# Patient Record
Sex: Male | Born: 1985 | Race: White | Hispanic: No | Marital: Single | Smoking: Current every day smoker
Health system: Southern US, Community
[De-identification: ages and names within clinical notes are randomized; demographics above are authoritative.]

## PROBLEM LIST (undated history)

## (undated) DIAGNOSIS — Z9989 Dependence on other enabling machines and devices: Secondary | ICD-10-CM

## (undated) DIAGNOSIS — J45909 Unspecified asthma, uncomplicated: Secondary | ICD-10-CM

## (undated) DIAGNOSIS — I319 Disease of pericardium, unspecified: Secondary | ICD-10-CM

## (undated) DIAGNOSIS — I514 Myocarditis, unspecified: Secondary | ICD-10-CM

## (undated) DIAGNOSIS — G4733 Obstructive sleep apnea (adult) (pediatric): Secondary | ICD-10-CM

## (undated) HISTORY — DX: Obstructive sleep apnea (adult) (pediatric): G47.33

## (undated) HISTORY — DX: Dependence on other enabling machines and devices: Z99.89

---

## 2007-12-12 ENCOUNTER — Ambulatory Visit: Payer: Self-pay | Admitting: Internal Medicine

## 2009-01-14 ENCOUNTER — Ambulatory Visit: Payer: Self-pay | Admitting: Family Medicine

## 2009-05-24 ENCOUNTER — Emergency Department: Payer: Self-pay | Admitting: Emergency Medicine

## 2009-05-26 ENCOUNTER — Emergency Department: Payer: Self-pay | Admitting: Emergency Medicine

## 2010-08-27 ENCOUNTER — Ambulatory Visit: Payer: Self-pay | Admitting: Internal Medicine

## 2010-11-13 ENCOUNTER — Ambulatory Visit: Payer: Self-pay | Admitting: Internal Medicine

## 2011-11-24 ENCOUNTER — Ambulatory Visit: Payer: Self-pay

## 2011-11-24 LAB — RAPID STREP-A WITH REFLX: Micro Text Report: NEGATIVE

## 2011-11-27 LAB — BETA STREP CULTURE(ARMC)

## 2012-05-11 ENCOUNTER — Ambulatory Visit: Payer: Self-pay | Admitting: Family Medicine

## 2012-05-30 ENCOUNTER — Ambulatory Visit: Payer: Self-pay | Admitting: Internal Medicine

## 2012-05-30 LAB — CBC WITH DIFFERENTIAL/PLATELET
Basophil #: 0.1 10*3/uL (ref 0.0–0.1)
Eosinophil %: 0.5 %
HCT: 45.3 % (ref 40.0–52.0)
Lymphocyte #: 2.8 10*3/uL (ref 1.0–3.6)
MCH: 30.4 pg (ref 26.0–34.0)
MCHC: 33.9 g/dL (ref 32.0–36.0)
MCV: 90 fL (ref 80–100)
Monocyte #: 1.7 x10 3/mm — ABNORMAL HIGH (ref 0.2–1.0)
Monocyte %: 9.3 %
Neutrophil #: 13.3 10*3/uL — ABNORMAL HIGH (ref 1.4–6.5)
Platelet: 202 10*3/uL (ref 150–440)
RBC: 5.06 10*6/uL (ref 4.40–5.90)
RDW: 12.7 % (ref 11.5–14.5)

## 2012-05-30 LAB — COMPREHENSIVE METABOLIC PANEL
Alkaline Phosphatase: 133 U/L (ref 50–136)
Anion Gap: 9 (ref 7–16)
BUN: 9 mg/dL (ref 7–18)
Bilirubin,Total: 0.5 mg/dL (ref 0.2–1.0)
Creatinine: 0.96 mg/dL (ref 0.60–1.30)
EGFR (African American): >60
EGFR (Non-African Amer.): >60
Glucose: 98 mg/dL (ref 65–99)
Osmolality: 280 (ref 275–301)
Total Protein: 8.4 g/dL — ABNORMAL HIGH (ref 6.4–8.2)

## 2012-06-01 LAB — BETA STREP CULTURE(ARMC)

## 2013-11-25 DIAGNOSIS — I319 Disease of pericardium, unspecified: Secondary | ICD-10-CM

## 2013-11-25 HISTORY — DX: Disease of pericardium, unspecified: I31.9

## 2013-12-03 ENCOUNTER — Ambulatory Visit: Payer: Self-pay

## 2013-12-03 LAB — CBC WITH DIFFERENTIAL/PLATELET
BASOS ABS: 0 10*3/uL (ref 0.0–0.1)
Basophil %: 0.2 %
EOS ABS: 0 10*3/uL (ref 0.0–0.7)
EOS PCT: 0 %
HCT: 44.7 % (ref 40.0–52.0)
HGB: 15 g/dL (ref 13.0–18.0)
LYMPHS ABS: 1.8 10*3/uL (ref 1.0–3.6)
LYMPHS PCT: 11.7 %
MCH: 29.4 pg (ref 26.0–34.0)
MCHC: 33.6 g/dL (ref 32.0–36.0)
MCV: 88 fL (ref 80–100)
MONO ABS: 1.4 x10 3/mm — AB (ref 0.2–1.0)
Monocyte %: 9 %
NEUTROS ABS: 12.2 10*3/uL — AB (ref 1.4–6.5)
Neutrophil %: 79.1 %
PLATELETS: 205 10*3/uL (ref 150–440)
RBC: 5.11 10*6/uL (ref 4.40–5.90)
RDW: 12.4 % (ref 11.5–14.5)
WBC: 15.4 10*3/uL — AB (ref 3.8–10.6)

## 2013-12-03 LAB — RAPID INFLUENZA A&B ANTIGENS

## 2013-12-06 ENCOUNTER — Ambulatory Visit: Payer: Self-pay

## 2013-12-10 ENCOUNTER — Encounter (HOSPITAL_COMMUNITY): Payer: Self-pay | Admitting: Internal Medicine

## 2013-12-10 ENCOUNTER — Emergency Department: Payer: Self-pay | Admitting: Emergency Medicine

## 2013-12-10 ENCOUNTER — Inpatient Hospital Stay (HOSPITAL_COMMUNITY)
Admission: AD | Admit: 2013-12-10 | Discharge: 2013-12-14 | DRG: 237 | Disposition: A | Discharge status: Home / Self Care | Payer: BC Managed Care – PPO | Source: Other Acute Inpatient Hospital | Attending: Internal Medicine | Admitting: Internal Medicine

## 2013-12-10 DIAGNOSIS — E876 Hypokalemia: Secondary | ICD-10-CM | POA: Diagnosis present

## 2013-12-10 DIAGNOSIS — F17200 Nicotine dependence, unspecified, uncomplicated: Secondary | ICD-10-CM | POA: Diagnosis present

## 2013-12-10 DIAGNOSIS — I309 Acute pericarditis, unspecified: Secondary | ICD-10-CM

## 2013-12-10 DIAGNOSIS — R0609 Other forms of dyspnea: Secondary | ICD-10-CM | POA: Diagnosis present

## 2013-12-10 DIAGNOSIS — J189 Pneumonia, unspecified organism: Secondary | ICD-10-CM | POA: Diagnosis present

## 2013-12-10 DIAGNOSIS — R0989 Other specified symptoms and signs involving the circulatory and respiratory systems: Secondary | ICD-10-CM | POA: Diagnosis present

## 2013-12-10 DIAGNOSIS — J45909 Unspecified asthma, uncomplicated: Secondary | ICD-10-CM | POA: Diagnosis present

## 2013-12-10 DIAGNOSIS — I3139 Other pericardial effusion (noninflammatory): Secondary | ICD-10-CM

## 2013-12-10 DIAGNOSIS — I319 Disease of pericardium, unspecified: Secondary | ICD-10-CM | POA: Diagnosis present

## 2013-12-10 DIAGNOSIS — I313 Pericardial effusion (noninflammatory): Secondary | ICD-10-CM

## 2013-12-10 DIAGNOSIS — A419 Sepsis, unspecified organism: Secondary | ICD-10-CM | POA: Diagnosis present

## 2013-12-10 DIAGNOSIS — R9431 Abnormal electrocardiogram [ECG] [EKG]: Secondary | ICD-10-CM | POA: Diagnosis present

## 2013-12-10 DIAGNOSIS — J180 Bronchopneumonia, unspecified organism: Secondary | ICD-10-CM | POA: Diagnosis present

## 2013-12-10 DIAGNOSIS — J90 Pleural effusion, not elsewhere classified: Secondary | ICD-10-CM

## 2013-12-10 DIAGNOSIS — E860 Dehydration: Secondary | ICD-10-CM | POA: Diagnosis present

## 2013-12-10 DIAGNOSIS — D649 Anemia, unspecified: Secondary | ICD-10-CM | POA: Diagnosis present

## 2013-12-10 DIAGNOSIS — D72829 Elevated white blood cell count, unspecified: Secondary | ICD-10-CM | POA: Diagnosis present

## 2013-12-10 DIAGNOSIS — E669 Obesity, unspecified: Secondary | ICD-10-CM | POA: Diagnosis present

## 2013-12-10 DIAGNOSIS — R509 Fever, unspecified: Secondary | ICD-10-CM | POA: Diagnosis present

## 2013-12-10 HISTORY — DX: Unspecified asthma, uncomplicated: J45.909

## 2013-12-10 LAB — URINE MICROSCOPIC-ADD ON

## 2013-12-10 LAB — CBC WITH DIFFERENTIAL/PLATELET
BASOS ABS: 0 10*3/uL (ref 0.0–0.1)
Basophil %: 0.3 %
EOS PCT: 0.2 %
Eosinophil #: 0 10*3/uL (ref 0.0–0.7)
HCT: 38.7 % — ABNORMAL LOW (ref 40.0–52.0)
HGB: 13.2 g/dL (ref 13.0–18.0)
LYMPHS PCT: 16.7 %
Lymphocyte #: 2.5 10*3/uL (ref 1.0–3.6)
MCH: 29.9 pg (ref 26.0–34.0)
MCHC: 34.1 g/dL (ref 32.0–36.0)
MCV: 88 fL (ref 80–100)
MONO ABS: 1.8 x10 3/mm — AB (ref 0.2–1.0)
Monocyte %: 12.3 %
NEUTROS PCT: 70.5 %
Neutrophil #: 10.6 10*3/uL — ABNORMAL HIGH (ref 1.4–6.5)
Platelet: 314 10*3/uL (ref 150–440)
RBC: 4.41 10*6/uL (ref 4.40–5.90)
RDW: 13 % (ref 11.5–14.5)
WBC: 15 10*3/uL — AB (ref 3.8–10.6)

## 2013-12-10 LAB — CBC
HCT: 32.3 % — ABNORMAL LOW (ref 39.0–52.0)
Hemoglobin: 11.5 g/dL — ABNORMAL LOW (ref 13.0–17.0)
MCH: 30.3 pg (ref 26.0–34.0)
MCHC: 35.6 g/dL (ref 30.0–36.0)
MCV: 85.2 fL (ref 78.0–100.0)
PLATELETS: 365 10*3/uL (ref 150–400)
RBC: 3.79 MIL/uL — ABNORMAL LOW (ref 4.22–5.81)
RDW: 13 % (ref 11.5–15.5)
WBC: 14 10*3/uL — ABNORMAL HIGH (ref 4.0–10.5)

## 2013-12-10 LAB — COMPREHENSIVE METABOLIC PANEL
ALK PHOS: 133 U/L — AB (ref 39–117)
ALT: 52 U/L (ref 0–53)
AST: 20 U/L (ref 0–37)
Albumin: 2.8 g/dL — ABNORMAL LOW (ref 3.5–5.2)
BILIRUBIN TOTAL: 0.7 mg/dL (ref 0.3–1.2)
BUN: 11 mg/dL (ref 6–23)
CHLORIDE: 96 meq/L (ref 96–112)
CO2: 25 mEq/L (ref 19–32)
Calcium: 8.8 mg/dL (ref 8.4–10.5)
Creatinine, Ser: 0.62 mg/dL (ref 0.50–1.35)
GFR calc non Af Amer: >90 mL/min (ref >90)
Glucose, Bld: 173 mg/dL — ABNORMAL HIGH (ref 70–99)
Potassium: 3.6 mEq/L — ABNORMAL LOW (ref 3.7–5.3)
SODIUM: 137 meq/L (ref 137–147)
Total Protein: 8 g/dL (ref 6.0–8.3)

## 2013-12-10 LAB — APTT: aPTT: 37 seconds (ref 24–37)

## 2013-12-10 LAB — COMPREHENSIVE METABOLIC PANEL WITH GFR
Albumin: 2.9 g/dL — ABNORMAL LOW
Alkaline Phosphatase: 157 U/L — ABNORMAL HIGH
Anion Gap: 11
BUN: 11 mg/dL
Bilirubin,Total: 0.8 mg/dL
Calcium, Total: 9.1 mg/dL
Chloride: 96 mmol/L — ABNORMAL LOW
Co2: 25 mmol/L
Creatinine: 0.86 mg/dL
EGFR (African American): >60
EGFR (Non-African Amer.): >60
Glucose: 105 mg/dL — ABNORMAL HIGH
Osmolality: 264
Potassium: 3 mmol/L — ABNORMAL LOW
SGOT(AST): 35 U/L
SGPT (ALT): 74 U/L
Sodium: 132 mmol/L — ABNORMAL LOW
Total Protein: 8.4 g/dL — ABNORMAL HIGH

## 2013-12-10 LAB — TYPE AND SCREEN
ABO/RH(D): O POS
Antibody Screen: NEGATIVE

## 2013-12-10 LAB — URINALYSIS, ROUTINE W REFLEX MICROSCOPIC
Glucose, UA: NEGATIVE mg/dL
HGB URINE DIPSTICK: NEGATIVE
KETONES UR: 40 mg/dL — AB
Leukocytes, UA: NEGATIVE
Nitrite: NEGATIVE
Protein, ur: 30 mg/dL — AB
SPECIFIC GRAVITY, URINE: 1.034 — AB (ref 1.005–1.030)
UROBILINOGEN UA: 4 mg/dL — AB (ref 0.0–1.0)
pH: 6.5 (ref 5.0–8.0)

## 2013-12-10 LAB — TROPONIN I: Troponin-I: <0.02 ng/mL

## 2013-12-10 LAB — SEDIMENTATION RATE: Sed Rate: 121 mm/hr — ABNORMAL HIGH (ref 0–16)

## 2013-12-10 LAB — PROTIME-INR
INR: 1.32 (ref 0.00–1.49)
PROTHROMBIN TIME: 16.1 s — AB (ref 11.6–15.2)

## 2013-12-10 LAB — MAGNESIUM: Magnesium: 2.4 mg/dL (ref 1.5–2.5)

## 2013-12-10 MED ORDER — CHLORHEXIDINE GLUCONATE 4 % EX LIQD
CUTANEOUS | Status: AC
  Filled 2013-12-10: qty 15

## 2013-12-10 MED ORDER — ACETAMINOPHEN 325 MG PO TABS
650.0000 mg | ORAL_TABLET | Freq: Four times a day (QID) | ORAL | Status: DC | PRN
  Administered 2013-12-10: 650 mg via ORAL
  Filled 2013-12-10: qty 2

## 2013-12-10 MED ORDER — ACETAMINOPHEN 650 MG RE SUPP: 650.0000 mg | Freq: Four times a day (QID) | RECTAL | Status: DC | PRN

## 2013-12-10 MED ORDER — DEXTROSE 5 % IV SOLN
1.0000 g | Freq: Three times a day (TID) | INTRAVENOUS | Status: DC
  Administered 2013-12-11 – 2013-12-14 (×9): 1 g via INTRAVENOUS
  Filled 2013-12-10 (×13): qty 1

## 2013-12-10 MED ORDER — ALBUTEROL SULFATE (2.5 MG/3ML) 0.083% IN NEBU: 2.5000 mg | INHALATION_SOLUTION | RESPIRATORY_TRACT | Status: DC | PRN

## 2013-12-10 MED ORDER — VANCOMYCIN HCL IN DEXTROSE 1-5 GM/200ML-% IV SOLN
1000.0000 mg | Freq: Two times a day (BID) | INTRAVENOUS | Status: DC
  Administered 2013-12-11 – 2013-12-13 (×5): 1000 mg via INTRAVENOUS
  Filled 2013-12-10 (×8): qty 200

## 2013-12-10 MED ORDER — ONDANSETRON HCL 4 MG PO TABS
4.0000 mg | ORAL_TABLET | Freq: Four times a day (QID) | ORAL | Status: DC | PRN
  Administered 2013-12-10: 4 mg via ORAL
  Filled 2013-12-10: qty 1

## 2013-12-10 MED ORDER — SODIUM CHLORIDE 0.9 % IJ SOLN: 3.0000 mL | Freq: Two times a day (BID) | INTRAMUSCULAR | Status: DC

## 2013-12-10 MED ORDER — ONDANSETRON HCL 4 MG/2ML IJ SOLN: 4.0000 mg | Freq: Four times a day (QID) | INTRAMUSCULAR | Status: DC | PRN

## 2013-12-10 MED ORDER — CHLORHEXIDINE GLUCONATE CLOTH 2 % EX PADS: 6.0000 | MEDICATED_PAD | Freq: Every day | CUTANEOUS | Status: DC

## 2013-12-10 MED ORDER — VANCOMYCIN HCL 10 G IV SOLR
2250.0000 mg | Freq: Once | INTRAVENOUS | Status: AC
  Administered 2013-12-11: 2250 mg via INTRAVENOUS
  Filled 2013-12-10: qty 2250

## 2013-12-10 MED ORDER — POTASSIUM CHLORIDE IN NACL 40-0.9 MEQ/L-% IV SOLN
INTRAVENOUS | Status: DC
  Administered 2013-12-10: 60 mL/h via INTRAVENOUS
  Filled 2013-12-10: qty 1000

## 2013-12-10 NOTE — Progress Notes (Addendum)
[address]       [place] [id number]             [phone]        Andrew MewDavid Harmon [facility] Medical Record #[id number] Date of Birth: [birth year]  Referring: No ref. provider found Primary Care: Andrew CarnesWoodruff, [name], MD  Chief Complaint:   Fever and sweats   History of Present Illness:     28 yo male with 10 day history of episodic fever, chills. He has been treated  as outpatient with tamiflu and Zithromax, but was told flu swab was negative. Presented to [facility] ER today because was not feeling better after 10 days of treatment, then transferred her from ER. Pateint is smoker, no one he lives with has been sick. Only [facility] was [place] [facility] [place]   Current Activity/ Functional Status: Patient is independent with mobility/ambulation, transfers, ADL's, IADL's.   Zubrod Score: At the time of surgery this patient's most appropriate activity status/level should be described as: [x]     0    Normal activity, no symptoms []     1    Restricted in physical strenuous activity but ambulatory, able to do out light work []     2    Ambulatory and capable of self care, unable to do work activities, up and about                 more than 50%  Of the time                            []     3    Only limited self care, in bed greater than 50% of waking hours []     4    Completely disabled, no self care, confined to bed or chair []     5    Moribund  Past Medical History  Diagnosis Date  . Asthma     Past Surgical History  Procedure Laterality Date  . No past surgeries      History  Smoking status  . Current Every Day Smoker -- 1.00 packs/day  Smokeless tobacco  . Not on file    History  Alcohol Use  . Yes    History   Social History  . Marital Status: Unknown    Spouse Name: N/A    Number of Children: N/A  . Years of Education: N/A   Occupational History  . Not on file.   Social History Main Topics  . Smoking status: Current Every Day  Smoker -- 1.00 packs/day  . Smokeless tobacco: Not on file  . Alcohol Use: Yes  . Drug Use: No  . Sexual Activity: Yes   Other Topics Concern  . Not on file   Social History Narrative  . No narrative on file    Allergies  Allergen Reactions  . Amoxicillin Hives  . Penicillins     Per his doctor    Current Facility-Administered Medications  Medication Dose Route Frequency Provider Last Rate Last Dose  . 0.9 % irrigation (POUR BTL)    PRN Delight OvensEdward B Boleslaw Borghi, MD   2,000 mL at 12/11/13 0716  . 0.9 % NaCl with KCl 40 mEq / L  infusion   Intravenous Continuous Elease EtienneAnand D Hongalgi, MD 60 mL/hr at 12/10/13 2300    . [facility] HOLD] acetaminophen (TYLENOL) tablet 650 mg  650 mg Oral Q6H PRN Theadora RamaAnand  Irena Cords, MD   650 mg at 12/10/13 2101   Or  . [facility] HOLD] acetaminophen (TYLENOL) suppository 650 mg  650 mg Rectal Q6H PRN Elease Etienne, MD      . [facility] HOLD] albuterol (PROVENTIL) (2.5 MG/3ML) 0.083% nebulizer solution 2.5 mg  2.5 mg Nebulization Q2H PRN Elease Etienne, MD      . [facility] HOLD] ceFEPIme (MAXIPIME) 1 g in dextrose 5 % 50 mL IVPB  1 g Intravenous Q8H Elease Etienne, MD   1 g at 12/11/13 0200  . chlorhexidine (HIBICLENS) 4 % liquid           . [[facility] HOLD] Chlorhexidine Gluconate Cloth 2 % PADS 6 each  6 each Topical Q0600 Delight Ovens, MD      . [facility] HOLD] ondansetron [facility]) tablet 4 mg  4 mg Oral Q6H PRN Elease Etienne, MD   4 mg at 12/10/13 2103   Or  . [[facility] HOLD] ondansetron (ZOFRAN) injection 4 mg  4 mg Intravenous Q6H PRN Elease Etienne, MD      . [facility] HOLD] sodium chloride 0.9 % injection 3 mL  3 mL Intravenous Q12H Elease Etienne, MD      . [facility] HOLD] vancomycin (VANCOCIN) IVPB 1000 mg/200 mL premix  1,000 mg Intravenous Q12H Elease Etienne, MD       Facility-Administered Medications Ordered in Other Encounters  Medication Dose Route Frequency Provider Last Rate Last Dose  . fentaNYL (SUBLIMAZE) injection    Anesthesia Intra-op Bishop Limbo, CRNA   50 mcg  at 12/11/13 [phone]  . lactated ringers infusion    Continuous PRN Bishop Limbo, CRNA      . lactated ringers infusion    Continuous PRN Bishop Limbo, CRNA      . midazolam (VERSED) 5 MG/5ML injection    Anesthesia Intra-op Bishop Limbo, CRNA   2 mg at 12/11/13 9604    Prescriptions prior to admission  Medication Sig Dispense Refill  . acetaminophen (TYLENOL) 325 MG tablet Take 650 mg by mouth every 6 (six) hours as needed for mild pain or headache.      . ibuprofen (ADVIL,MOTRIN) 800 MG tablet Take 800 mg by mouth every 8 (eight) hours as needed.        History reviewed. No pertinent family history.   Review of Systems:     Cardiac Review of Systems: Y or N  Chest Pain [ y  ]  Resting SOB [n   ] Exertional SOB  Cove.Etienne  ]  Kenese.Mounts [  ]   Pedal Edema [   ]    Palpitations [  ] Syncope  [  ]   Presyncope [   ]  General Review of Systems: [Y] = yes [  ]=no Constitional: recent weight change [ n]; anorexia [ y ]; fatigue [ y ]; nausea Cove.Etienne  ]; night sweats [  ]; fever [ y ]; or chills [ y ]                                                               Dental: poor dentition[  ]; Last Dentist visit:   Eye : blurred vision [  ]; diplopia [   ]; vision  changes [  ];  Amaurosis fugax[  ]; Resp: cough [ y ];  wheezing[  ];  hemoptysis[ n ]; shortness of breath[y  ]; paroxysmal nocturnal dyspnea[  ]; dyspnea on exertion[  ]; or orthopnea[  ];  GI:  gallstones[  ], vomiting[  ];  dysphagia[  ]; melena[  ];  hematochezia [  ]; heartburn[  ];   Hx of  Colonoscopy[  ]; GU: kidney stones [  ]; hematuria[  ];   dysuria [  ];  nocturia[  ];  history of     obstruction [  ]; urinary frequency [ n ]             Skin: rash, swelling[  ];, hair loss[  ];  peripheral edema[  ];  or itching[  ]; Musculosketetal: myalgias[  ];  joint swelling[  ];  joint erythema[  ];  joint pain[  ];  back pain[  ];  Heme/Lymph: bruising[  ];  bleeding[  ];  anemia[  ];  Neuro: TIA[ n ];  headaches[  ];  stroke[   ];  vertigo[  ];  seizures[  ];   paresthesias[  ];  difficulty walking[ n];  Psych:depression[n  ]; anxiety[ n ];  Endocrine: diabetes[n  ];  thyroid dysfunction[  ];  Immunizations: Flu [ n ]; Pneumococcal[n  ];  Other:  Physical Exam: BP 137/68  Pulse 94  Temp(Src) 98.5 F (36.9 C) (Oral)  Resp 19  Ht 5\' 10"  (1.778 m)  Wt 237 lb 3.4 oz (107.6 kg)  BMI 34.04 kg/m2  SpO2 94%  General appearance: alert, cooperative and appears stated age Neurologic: intact Heart: regular rate and rhythm, S1, S2 normal, no murmur, click, rub or gallop and no rub Lungs: diminished breath sounds bibasilar Abdomen: soft, non-tender; bowel sounds normal; no masses,  no organomegaly Extremities: extremities normal, atraumatic, no cyanosis or edema and Homans sign is negative, no sign of DVT no jvd diaphoric at time of exam   Diagnostic Studies & Laboratory data:     Recent Radiology Findings:  See pacs for films at [facility] ct of chest shows 2.3 cm pericardial effusion, prob transudate by ct numbers  Recent Lab Findings: Lab Results  Component Value Date   WBC 14.0* 12/10/2013   HGB 11.5* 12/10/2013   HCT 32.3* 12/10/2013   PLT 365 12/10/2013   GLUCOSE 173* 12/10/2013   ALT 52 12/10/2013   AST 20 12/10/2013   NA 137 12/10/2013   K 3.6* 12/10/2013   CL 96 12/10/2013   CREATININE 0.62 12/10/2013   BUN 11 12/10/2013   CO2 25 12/10/2013   INR 1.32 12/10/2013     Assessment / Plan:     Mod to large pericardial effusion by ct scan of the chest, poss  Viral. I have discussed with patient drainage with pericardial window in the am both for therapeutic reasons but also for diagnostic reasons.  Patient does not appear in tamponade clinically.        Delight Ovens MD      [address] [employer].[address] [place] [address] Office [phone]   Beeper [phone]  12/11/2013 7:19 AM

## 2013-12-10 NOTE — Progress Notes (Signed)
ANTIBIOTIC CONSULT NOTE - INITIAL  Pharmacy Consult for vancomycin Indication: pneumonia  Allergies  Allergen Reactions  . Amoxicillin   . Penicillins     Patient Measurements: Height: 5\' 10"  (177.8 cm) Weight: 236 lb 15.9 oz (107.5 kg) IBW/kg (Calculated) : 73 Adjusted Body Weight: 86.8 kg  Vital Signs: Temp: 98.1 F (36.7 C) (02/16 2029) Temp src: Oral (02/16 2029) BP: 127/91 mmHg (02/16 2029) Pulse Rate: 91 (02/16 2029)   Labs: No results found for this basename: WBC, HGB, PLT, LABCREA, CREATININE,  in the last 72 hours CrCl is unknown because no creatinine reading has been taken. No results found for this basename: VANCOTROUGH, VANCOPEAK, VANCORANDOM, GENTTROUGH, GENTPEAK, GENTRANDOM, TOBRATROUGH, TOBRAPEAK, TOBRARND, AMIKACINPEAK, AMIKACINTROU, AMIKACIN,  in the last 72 hours   Medical History: Past Medical History  Diagnosis Date  . Asthma     Medications:  Prescriptions prior to admission  Medication Sig Dispense Refill  . acetaminophen (TYLENOL) 325 MG tablet Take 325-650 mg by mouth every 6 (six) hours as needed for mild pain, fever or headache.      . ibuprofen (ADVIL,MOTRIN) 200 MG tablet Take 200-400 mg by mouth every 6 (six) hours as needed for fever, headache or mild pain.       Assessment: 28 year old male with history of cough and sob.  Recently completed a course of tamiflu and azithromycin as an outpatient, with no improvement in symptoms.  Pt presented today to an outside hospital, notable labs: wbc 15, Scr 0.86, K 3, Na 132.  Pt received levofloxacin 750 mg x1 at outside hospital.  Pharmacy consulted to dose vancomycin.  Blood cultures x2 drawn.  Goal of Therapy:  Vancomycin trough level 15-20 mcg/ml  Plan:  Vancomycin 2250 mg IV x1 loading dose Followed by vancomycin 1 g IV q12h Cefepime 1 g IV q8h Follow Scr, cultures, clinical progress  Agapito Games, PharmD, BCPS Clinical Pharmacist 12/10/2013 8:57 PM

## 2013-12-10 NOTE — Progress Notes (Signed)
Pt arrived to 2C17 via [facility]. Pt awake, alert and oriented. No apparent discomfort or distress. Pt denies pain at this time. VSS. Dr. Thedore Mins  - admitting MD - notified of patients arrival. Awaiting admission orders. Pt is oriented to the room, bed, etc. Bed low and locked, call light within reach, side rails up x2. Will continue to monitor.  - Alwyn Ren, RN

## 2013-12-10 NOTE — Consult Note (Signed)
Cardiology Consult Note Andrew Massed, MD No ref. provider found  Reason for consult: pericardial effusion  History of Present Illness (and review of medical records): Andrew Harmon is a 28 y.o. male who presents for evaluation of pericardial effusion as a transfer from [facility].  He has no prior cardiac history and not currently treated for any chronic medical problems.  He presented to the ED with persistent fevers up to 103.  Patient states that this was his 8 day with fever.  He reports fevers, chills, and productive cough.  He also reports chest discomfort with pleuritic symptoms which were initial worse around 9/10 and now 2/10.  He has been treated with Azithromycin as outpatient with no significant improvement.  CT scan revealed moderate- large pericardial effusion, along with patchy opacity in Left upper lob c/w infection/PNA.  EKG also revealed diffuse ST elevations with PR depression.  He is hemodynamically stable without signs of tamponade.  He has been admitted to hospitalist service and evaluated by Dr. Servando Snare for possible pericardial window.  Review of Systems Pertinent items are noted in HPI. Further review of systems was otherwise negative other than stated in HPI.  Patient Active Problem List   Diagnosis Date Noted  . Pericardial effusion 12/10/2013  . Fever, unspecified 12/10/2013  . Leukocytosis, unspecified 12/10/2013  . CAP (community acquired pneumonia) 12/10/2013  . Hypokalemia 12/10/2013   Past Medical History  Diagnosis Date  . Asthma     Past Surgical History  Procedure Laterality Date  . No past surgeries      Prescriptions prior to admission  Medication Sig Dispense Refill  . acetaminophen (TYLENOL) 325 MG tablet Take 325-650 mg by mouth every 6 (six) hours as needed for mild pain, fever or headache.      . ibuprofen (ADVIL,MOTRIN) 200 MG tablet Take 200-400 mg by mouth every 6 (six) hours as needed for fever, headache or mild pain.       Allergies   Allergen Reactions  . Amoxicillin Hives    History  Substance Use Topics  . Smoking status: Current Every Day Smoker -- 1.00 packs/day  . Smokeless tobacco: Not on file  . Alcohol Use: Yes    History reviewed. No pertinent family history.   Objective:  Patient Vitals for the past 8 hrs:  BP Temp Temp src Pulse Resp SpO2 Height Weight  12/10/13 2100 134/76 mmHg - - 93 22 93 % - -  12/10/13 2046 - - - - - 93 % - -  12/10/13 2029 127/91 mmHg 98.1 F (36.7 C) Oral 91 33 94 % - -  12/10/13 1900 134/90 mmHg 98.2 F (36.8 C) Oral 102 14 96 % '5\' 10"'  (1.778 m) 107.5 kg (236 lb 15.9 oz)   General appearance: alert, cooperative, obese male in NAD Head: Normocephalic, without obvious abnormality, atraumatic Eyes: conjunctivae/corneas clear. PERRL, EOM's intact. Fundi benign. Neck: no carotid bruit, no JVD and supple, symmetrical, trachea midline Lungs: clear to auscultation bilaterally Chest wall: no tenderness Heart: regular rate and rhythm, S1, S2 normal, no rubs appreciated Abdomen: soft, non-tender; bowel sounds normal; no masses,  no organomegaly Extremities: extremities normal, atraumatic, no cyanosis or edema Pulses: 2+ and symmetric Neurologic: Grossly normal  Results for orders placed during the hospital encounter of 12/10/13 (from the past 48 hour(s))  CBC     Status: Abnormal   Collection Time    12/10/13 10:04 PM      Result Value Ref Range   WBC 14.0 (*) 4.0 -  10.5 K/uL   RBC 3.79 (*) 4.22 - 5.81 MIL/uL   Hemoglobin 11.5 (*) 13.0 - 17.0 g/dL   HCT 32.3 (*) 39.0 - 52.0 %   MCV 85.2  78.0 - 100.0 fL   MCH 30.3  26.0 - 34.0 pg   MCHC 35.6  30.0 - 36.0 g/dL   RDW 13.0  11.5 - 15.5 %   Platelets 365  150 - 400 K/uL  PROTIME-INR     Status: Abnormal   Collection Time    12/10/13 10:04 PM      Result Value Ref Range   Prothrombin Time 16.1 (*) 11.6 - 15.2 seconds   INR 1.32  0.00 - 1.49  APTT     Status: None   Collection Time    12/10/13 10:04 PM      Result  Value Ref Range   aPTT 37  24 - 37 seconds   Comment:            IF BASELINE aPTT IS ELEVATED,     SUGGEST PATIENT RISK ASSESSMENT     BE USED TO DETERMINE APPROPRIATE     ANTICOAGULANT THERAPY.  TYPE AND SCREEN     Status: None   Collection Time    12/10/13 10:04 PM      Result Value Ref Range   ABO/RH(D) O POS     Antibody Screen PENDING     Sample Expiration 12/13/2013    URINALYSIS, ROUTINE W REFLEX MICROSCOPIC     Status: Abnormal   Collection Time    12/10/13 10:06 PM      Result Value Ref Range   Color, Urine AMBER (*) YELLOW   Comment: BIOCHEMICALS MAY BE AFFECTED BY COLOR   APPearance CLOUDY (*) CLEAR   Specific Gravity, Urine 1.034 (*) 1.005 - 1.030   pH 6.5  5.0 - 8.0   Glucose, UA NEGATIVE  NEGATIVE mg/dL   Hgb urine dipstick NEGATIVE  NEGATIVE   Bilirubin Urine MODERATE (*) NEGATIVE   Ketones, ur 40 (*) NEGATIVE mg/dL   Protein, ur 30 (*) NEGATIVE mg/dL   Urobilinogen, UA 4.0 (*) 0.0 - 1.0 mg/dL   Nitrite NEGATIVE  NEGATIVE   Leukocytes, UA NEGATIVE  NEGATIVE  URINE MICROSCOPIC-ADD ON     Status: None   Collection Time    12/10/13 10:06 PM      Result Value Ref Range   WBC, UA 0-2  <3 WBC/hpf   RBC / HPF 0-2  <3 RBC/hpf   Bacteria, UA RARE  RARE   Urine-Other MUCOUS PRESENT     No results found.  ECG:  From [facility] show diffuse ST elevation with PR depression, no reciprocal ST depression, more c/w pericarditis, remains on current telemetry strips in stepdown  Impression: Acute pericarditis Moderate to large pericardial effusion w/o tamponade Community acquired PNA Hypokalemia  Recommendations: -Agree with TTE in am, assess size, location, and optimal window for drainage, likely need diagnostic and therapeutic procedure -Will need to discuss in am with CTS re pericardiocentesis vs pericardial window -Check ESR, CRP -Suggest Colchicine 0.61m BID and Ibuprofen 6036mTID -Empiric antibiotics -Replace electrolytes -Gentle IVFs  We will follow along with  you and make further recommendations as indicated.

## 2013-12-10 NOTE — H&P (Addendum)
History and Physical  Andrew Harmon [id number] DOB: 10-22-1986 DOA: 12/10/2013  Referring physician: [facility] ED PCP: Quincy Carnes, MD  Outpatient Specialists:  1. None  Chief Complaint: Fever, cough and difficulty breathing  HPI: Andrew Harmon is a 28 y.o. male with history of childhood asthma that he has outgrown, no other past medical history, transferred from [facility] ED secondary to pericardial effusion for evaluation by cardiology and CTS. Patient states that he was in his usual state of health until 12/02/13 when he developed subacute onset of dyspnea which was worse on lying down. Prior to that he had a couple days of intermittent mild dry cough. He denied any chest pain at that time. The next day, he was seen at an urgent care where flu testing was apparently negative. By this time, patient states that he had a fever of 104F. He was treated for flu and a bacterial infection with a course of Tamiflu and Z-Pak. His cough subsequently got productive of white frothy sputum, dyspnea seemed to get better but still has dyspnea on exertion but orthopnea has improved, developed chest pain which is worse with deep inspiration or coughing, continues to have low-grade fever 99-101F. Due to persisting symptoms, the patient went to the ED today where evaluation revealed CT scan with moderate to large pericardial effusion? Pericarditis and patchy opacity anterior inferior left upper lobe compatible with bronchopneumonia. He was transferred to [facility]. His mother had upper respiratory tract symptoms prior to patient getting sick. He recently travel to [place] but no international travel. He has pet dogs-none new. No insect bites or skin rashes.   Review of Systems: All systems reviewed and apart from history of presenting illness, are negative.  Past Medical History  Diagnosis Date  . Asthma    Past Surgical History  Procedure Laterality Date  . No  past surgeries     Social History:  reports that he has been smoking.  He does not have any smokeless tobacco history on file. He reports that he drinks alcohol. He reports that he does not use illicit drugs.   Allergies  Allergen Reactions  . Amoxicillin Hives    History reviewed. No pertinent family history. grandmother had diabetes.  Prior to Admission medications   Medication Sig Start Date End Date Taking? Authorizing Provider  acetaminophen (TYLENOL) 325 MG tablet Take 325-650 mg by mouth every 6 (six) hours as needed for mild pain, fever or headache.   Yes Historical Provider, MD  ibuprofen (ADVIL,MOTRIN) 200 MG tablet Take 200-400 mg by mouth every 6 (six) hours as needed for fever, headache or mild pain.   Yes Historical Provider, MD   Physical Exam: Filed Vitals:   12/10/13 1900 12/10/13 2029  BP: 134/90 127/91  Pulse: 102 91  Temp: 98.2 F (36.8 C) 98.1 F (36.7 C)  TempSrc: Oral Oral  Resp: 14 33  Height: 5\' 10"  (1.778 m)   Weight: 107.5 kg (236 lb 15.9 oz)   SpO2: 96% 94%     General exam: Moderately built and nourished young male patient, lying comfortably propped up in bed in no obvious distress. Does not look septic or toxic.  Head, eyes and ENT: Nontraumatic and normocephalic. Pupils equally reacting to light and accommodation. Oral mucosa mildly dry.  Neck: Supple. No JVD, carotid bruit or thyromegaly.  Lymphatics: No lymphadenopathy.  Respiratory system: Clear to auscultation. No increased work of breathing.  Cardiovascular system: S1 and S2 heard, RRR. No JVD, murmurs,  gallops, clicks or pedal edema.  Gastrointestinal system: Abdomen is nondistended, soft and nontender. Normal bowel sounds heard. No organomegaly or masses appreciated.  Central nervous system: Alert and oriented. No focal neurological deficits.  Extremities: Symmetric 5 x 5 power. Peripheral pulses symmetrically felt.   Skin: No rashes or acute findings.  Musculoskeletal system:  Negative exam.  Psychiatry: Pleasant and cooperative.   Labs on Admission: At outside hospital 1. CT chest without contrast: Impression: 1. Moderate to large pericardial effusion (favor transudative). Quit he pericarditis. 2. Patchy opacity anterior inferior left upper lobe compatible with focal infection/bronchopneumonia in this setting. 2. Chest x-ray: Impression heart appears enlarged compared to recent prior study. Given this rapid apparent change in cardiac site, correlation with echocardiography may be advised further assess. No edema or consolidation. 3. CBC: WBC 15, hemoglobin 13.2, hematocrit 38.7 and platelets 214. Neutrophils 70.5% 4. CMP: Glucose 105, BUN 11, creatinine 0.86, sodium 132, potassium 3, chloride 96, bicarbonate 25, calcium 9.1, total bilirubin 0.8, alkaline phosphatase 157, ALT 74, AST 35, total protein 8.4, albumin 2.9, serum osmolarity 264 5. Troponin <0.02 6. EKG: Reviewed and interpreted. Sinus tachycardia to 110 beats per minute, normal axis no acute changes. QTC 525 ms. No results found.    Assessment/Plan Principal Problem:   Pericardial effusion Active Problems:   Fever, unspecified   Leukocytosis, unspecified   CAP (community acquired pneumonia)   Hypokalemia   1. Pericardial effusion/pericarditis: Moderate to large per CT chest. No clinical features of cardiac tamponade. Admitted to step down. Check 2-D echo. DD-infectious (possibly viral, less likely bacterial) versus other etiology. Check blood cultures. CTS has consulted and recommend cardiology consultation-called. N.p.o. after midnight for possible pericardiocentesis versus pericardial window in a.m. As per discussion with CTS, avoid NSAIDs until procedure in a.m. Continue antibiotics which may narrowed based on clinical course and cultures.  2. Community acquired pneumonia: Failed outpatient CPAP. Completed a course of Tamiflu. Treat with broad-spectrum IV antibiotics-vancomycin and cefepime which  can be narrowed based on clinical course and culture results. 3. Hypokalemia: Follow BMP and replace as needed. Check magnesium 4. Leukocytosis: Secondary to pneumonia and possible pericarditis. Follow CBC. Management as above.  5. Dehydration: Brief IV fluids. 6. Prolonged QTC: Replace potassium. Check magnesium. Follow 2-D echo to monitor on telemetry. Follow EKGs.     Code Status: Full   Family Communication: Discussed with patient's mother at bedside.   Disposition Plan: Home when medically stable   Time spent: 65 minutes   Lucion Dilger, MD, FACP, FHM. [facility] Pager [phone]  If 7PM-7AM, please contact night-coverage [email or web address] Password [email or web address] 12/10/2013, 8:59 PM

## 2013-12-11 ENCOUNTER — Encounter (HOSPITAL_COMMUNITY): Payer: Self-pay | Admitting: *Deleted

## 2013-12-11 ENCOUNTER — Encounter (HOSPITAL_COMMUNITY): Payer: Self-pay | Admitting: Pharmacy Technician

## 2013-12-11 ENCOUNTER — Encounter (HOSPITAL_COMMUNITY): Payer: BC Managed Care – PPO | Admitting: Anesthesiology

## 2013-12-11 ENCOUNTER — Inpatient Hospital Stay (HOSPITAL_COMMUNITY): Payer: BC Managed Care – PPO

## 2013-12-11 ENCOUNTER — Inpatient Hospital Stay: Admit: 2013-12-11 | Payer: Self-pay | Admitting: Cardiothoracic Surgery

## 2013-12-11 ENCOUNTER — Inpatient Hospital Stay (HOSPITAL_COMMUNITY): Payer: BC Managed Care – PPO | Admitting: Anesthesiology

## 2013-12-11 ENCOUNTER — Encounter (HOSPITAL_COMMUNITY)
Admission: AD | Disposition: A | Discharge status: Home / Self Care | Payer: Self-pay | Source: Other Acute Inpatient Hospital | Attending: Internal Medicine

## 2013-12-11 DIAGNOSIS — J189 Pneumonia, unspecified organism: Secondary | ICD-10-CM

## 2013-12-11 DIAGNOSIS — I319 Disease of pericardium, unspecified: Secondary | ICD-10-CM

## 2013-12-11 DIAGNOSIS — I309 Acute pericarditis, unspecified: Principal | ICD-10-CM

## 2013-12-11 HISTORY — PX: INTRAOPERATIVE TRANSESOPHAGEAL ECHOCARDIOGRAM: SHX5062

## 2013-12-11 HISTORY — PX: PERICARDIAL WINDOW: SHX2213

## 2013-12-11 LAB — ABO/RH: ABO/RH(D): O POS

## 2013-12-11 LAB — EXPECTORATED SPUTUM ASSESSMENT W GRAM STAIN, RFLX TO RESP C

## 2013-12-11 LAB — GRAM STAIN

## 2013-12-11 LAB — POCT I-STAT 7, (LYTES, BLD GAS, ICA,H+H)
ACID-BASE EXCESS: 3 mmol/L — AB (ref 0.0–2.0)
Bicarbonate: 26.4 mEq/L — ABNORMAL HIGH (ref 20.0–24.0)
Calcium, Ion: 1.18 mmol/L (ref 1.12–1.23)
HCT: 33 % — ABNORMAL LOW (ref 39.0–52.0)
Hemoglobin: 11.2 g/dL — ABNORMAL LOW (ref 13.0–17.0)
O2 Saturation: 97 %
Potassium: 3.6 mEq/L — ABNORMAL LOW (ref 3.7–5.3)
Sodium: 137 mEq/L (ref 137–147)
TCO2: 28 mmol/L (ref 0–100)
pCO2 arterial: 35.8 mmHg (ref 35.0–45.0)
pH, Arterial: 7.476 — ABNORMAL HIGH (ref 7.350–7.450)
pO2, Arterial: 86 mmHg (ref 80.0–100.0)

## 2013-12-11 LAB — LEGIONELLA ANTIGEN, URINE: Legionella Antigen, Urine: NEGATIVE

## 2013-12-11 LAB — MRSA PCR SCREENING: MRSA by PCR: NEGATIVE

## 2013-12-11 LAB — BODY FLUID CELL COUNT WITH DIFFERENTIAL
Eos, Fluid: 0 %
LYMPHS FL: 28 %
Monocyte-Macrophage-Serous Fluid: 9 % — ABNORMAL LOW (ref 50–90)
Neutrophil Count, Fluid: 63 % — ABNORMAL HIGH (ref 0–25)
Total Nucleated Cell Count, Fluid: 1800 cu mm — ABNORMAL HIGH (ref 0–1000)

## 2013-12-11 LAB — GLUCOSE, SEROUS FLUID: Glucose, Fluid: 80 mg/dL

## 2013-12-11 LAB — HIV ANTIBODY (ROUTINE TESTING W REFLEX): HIV: NONREACTIVE

## 2013-12-11 LAB — LACTATE DEHYDROGENASE, PLEURAL OR PERITONEAL FLUID: LD FL: 1642 U/L — AB (ref 3–23)

## 2013-12-11 LAB — STREP PNEUMONIAE URINARY ANTIGEN: Strep Pneumo Urinary Antigen: NEGATIVE

## 2013-12-11 SURGERY — CREATION, PERICARDIAL WINDOW
Anesthesia: General

## 2013-12-11 MED ORDER — ONDANSETRON HCL 4 MG/2ML IJ SOLN: 4.0000 mg | Freq: Four times a day (QID) | INTRAMUSCULAR | Status: DC | PRN

## 2013-12-11 MED ORDER — ALBUTEROL SULFATE HFA 108 (90 BASE) MCG/ACT IN AERS
INHALATION_SPRAY | RESPIRATORY_TRACT | Status: DC | PRN
  Administered 2013-12-11: 6 via RESPIRATORY_TRACT

## 2013-12-11 MED ORDER — LACTATED RINGERS IV SOLN
INTRAVENOUS | Status: DC | PRN
  Administered 2013-12-11: 07:00:00 via INTRAVENOUS

## 2013-12-11 MED ORDER — ACETAMINOPHEN 160 MG/5ML PO SOLN
1000.0000 mg | Freq: Four times a day (QID) | ORAL | Status: AC
  Filled 2013-12-11 (×4): qty 40

## 2013-12-11 MED ORDER — OXYCODONE HCL 5 MG/5ML PO SOLN: 5.0000 mg | Freq: Once | ORAL | Status: DC | PRN

## 2013-12-11 MED ORDER — FENTANYL CITRATE 0.05 MG/ML IJ SOLN
INTRAMUSCULAR | Status: DC | PRN
  Administered 2013-12-11: 50 ug via INTRAVENOUS
  Administered 2013-12-11: 100 ug via INTRAVENOUS
  Administered 2013-12-11 (×3): 50 ug via INTRAVENOUS
  Administered 2013-12-11: 150 ug via INTRAVENOUS
  Administered 2013-12-11: 50 ug via INTRAVENOUS

## 2013-12-11 MED ORDER — MIDAZOLAM HCL 2 MG/2ML IJ SOLN
INTRAMUSCULAR | Status: AC
  Filled 2013-12-11: qty 2

## 2013-12-11 MED ORDER — ONDANSETRON HCL 4 MG/2ML IJ SOLN
INTRAMUSCULAR | Status: DC | PRN
  Administered 2013-12-11: 4 mg via INTRAVENOUS

## 2013-12-11 MED ORDER — ONDANSETRON HCL 4 MG/2ML IJ SOLN
4.0000 mg | Freq: Four times a day (QID) | INTRAMUSCULAR | Status: DC | PRN
  Filled 2013-12-11: qty 2

## 2013-12-11 MED ORDER — HYDROMORPHONE HCL PF 1 MG/ML IJ SOLN
INTRAMUSCULAR | Status: AC
  Filled 2013-12-11: qty 1

## 2013-12-11 MED ORDER — MIDAZOLAM HCL 5 MG/5ML IJ SOLN
INTRAMUSCULAR | Status: DC | PRN
  Administered 2013-12-11: 2 mg via INTRAVENOUS

## 2013-12-11 MED ORDER — NEOSTIGMINE METHYLSULFATE 1 MG/ML IJ SOLN
INTRAMUSCULAR | Status: AC
  Filled 2013-12-11: qty 10

## 2013-12-11 MED ORDER — ONDANSETRON HCL 4 MG/2ML IJ SOLN
INTRAMUSCULAR | Status: AC
  Filled 2013-12-11: qty 2

## 2013-12-11 MED ORDER — FENTANYL CITRATE 0.05 MG/ML IJ SOLN
INTRAMUSCULAR | Status: AC
  Filled 2013-12-11: qty 5

## 2013-12-11 MED ORDER — COLCHICINE 0.6 MG PO TABS
0.6000 mg | ORAL_TABLET | Freq: Two times a day (BID) | ORAL | Status: DC
  Administered 2013-12-11 – 2013-12-14 (×6): 0.6 mg via ORAL
  Filled 2013-12-11 (×7): qty 1

## 2013-12-11 MED ORDER — OXYCODONE-ACETAMINOPHEN 5-325 MG PO TABS: 1.0000 | ORAL_TABLET | ORAL | Status: DC | PRN

## 2013-12-11 MED ORDER — ALBUTEROL SULFATE HFA 108 (90 BASE) MCG/ACT IN AERS
INHALATION_SPRAY | RESPIRATORY_TRACT | Status: AC
  Filled 2013-12-11: qty 6.7

## 2013-12-11 MED ORDER — NEOSTIGMINE METHYLSULFATE 1 MG/ML IJ SOLN
INTRAMUSCULAR | Status: DC | PRN
  Administered 2013-12-11: 5 mg via INTRAVENOUS

## 2013-12-11 MED ORDER — OXYCODONE HCL 5 MG PO TABS
5.0000 mg | ORAL_TABLET | ORAL | Status: DC | PRN
  Administered 2013-12-12: 5 mg via ORAL
  Filled 2013-12-11: qty 1

## 2013-12-11 MED ORDER — OXYCODONE HCL 5 MG PO TABS: 5.0000 mg | ORAL_TABLET | Freq: Once | ORAL | Status: DC | PRN

## 2013-12-11 MED ORDER — LABETALOL HCL 5 MG/ML IV SOLN
INTRAVENOUS | Status: DC | PRN
  Administered 2013-12-11: 5 mg via INTRAVENOUS
  Administered 2013-12-11 (×2): 10 mg via INTRAVENOUS
  Administered 2013-12-11 (×3): 5 mg via INTRAVENOUS

## 2013-12-11 MED ORDER — LABETALOL HCL 5 MG/ML IV SOLN
INTRAVENOUS | Status: AC
  Filled 2013-12-11: qty 4

## 2013-12-11 MED ORDER — GLYCOPYRROLATE 0.2 MG/ML IJ SOLN
INTRAMUSCULAR | Status: DC | PRN
  Administered 2013-12-11: 0.6 mg via INTRAVENOUS

## 2013-12-11 MED ORDER — SUCCINYLCHOLINE CHLORIDE 20 MG/ML IJ SOLN
INTRAMUSCULAR | Status: AC
  Filled 2013-12-11: qty 1

## 2013-12-11 MED ORDER — PROPOFOL 10 MG/ML IV BOLUS
INTRAVENOUS | Status: DC | PRN
  Administered 2013-12-11: 170 mg via INTRAVENOUS

## 2013-12-11 MED ORDER — NITROGLYCERIN IN D5W 200-5 MCG/ML-% IV SOLN
INTRAVENOUS | Status: DC | PRN
  Administered 2013-12-11: 50 ug/min via INTRAVENOUS

## 2013-12-11 MED ORDER — LEVALBUTEROL HCL 0.63 MG/3ML IN NEBU
0.6300 mg | INHALATION_SOLUTION | Freq: Three times a day (TID) | RESPIRATORY_TRACT | Status: DC
  Administered 2013-12-11 – 2013-12-12 (×2): 0.63 mg via RESPIRATORY_TRACT
  Filled 2013-12-11 (×3): qty 3

## 2013-12-11 MED ORDER — SODIUM CHLORIDE 0.9 % IJ SOLN: 9.0000 mL | INTRAMUSCULAR | Status: DC | PRN

## 2013-12-11 MED ORDER — ARTIFICIAL TEARS OP OINT
TOPICAL_OINTMENT | OPHTHALMIC | Status: DC | PRN
  Administered 2013-12-11: 1 via OPHTHALMIC

## 2013-12-11 MED ORDER — 0.9 % SODIUM CHLORIDE (POUR BTL) OPTIME
TOPICAL | Status: DC | PRN
  Administered 2013-12-11: 2000 mL

## 2013-12-11 MED ORDER — SENNOSIDES-DOCUSATE SODIUM 8.6-50 MG PO TABS
1.0000 | ORAL_TABLET | Freq: Every evening | ORAL | Status: DC | PRN
  Filled 2013-12-11: qty 1

## 2013-12-11 MED ORDER — COLCHICINE 0.6 MG PO TABS
0.6000 mg | ORAL_TABLET | Freq: Two times a day (BID) | ORAL | Status: DC
Start: 2013-12-11 — End: 2013-12-11
  Filled 2013-12-11 (×2): qty 1

## 2013-12-11 MED ORDER — ARTIFICIAL TEARS OP OINT
TOPICAL_OINTMENT | OPHTHALMIC | Status: AC
  Filled 2013-12-11: qty 3.5

## 2013-12-11 MED ORDER — TRAMADOL HCL 50 MG PO TABS: 50.0000 mg | ORAL_TABLET | Freq: Four times a day (QID) | ORAL | Status: DC | PRN

## 2013-12-11 MED ORDER — POTASSIUM CHLORIDE 10 MEQ/50ML IV SOLN: 10.0000 meq | Freq: Every day | INTRAVENOUS | Status: DC | PRN

## 2013-12-11 MED ORDER — ONDANSETRON HCL 4 MG/2ML IJ SOLN: 4.0000 mg | Freq: Once | INTRAMUSCULAR | Status: DC | PRN

## 2013-12-11 MED ORDER — BISACODYL 5 MG PO TBEC
10.0000 mg | DELAYED_RELEASE_TABLET | Freq: Every day | ORAL | Status: DC
  Administered 2013-12-12: 10 mg via ORAL
  Filled 2013-12-11: qty 2

## 2013-12-11 MED ORDER — LIDOCAINE HCL (CARDIAC) 20 MG/ML IV SOLN
INTRAVENOUS | Status: DC | PRN
  Administered 2013-12-11: 100 mg via INTRAVENOUS

## 2013-12-11 MED ORDER — NALOXONE HCL 0.4 MG/ML IJ SOLN
0.4000 mg | INTRAMUSCULAR | Status: DC | PRN
  Filled 2013-12-11: qty 1

## 2013-12-11 MED ORDER — SUCCINYLCHOLINE CHLORIDE 20 MG/ML IJ SOLN
INTRAMUSCULAR | Status: DC | PRN
  Administered 2013-12-11: 100 mg via INTRAVENOUS

## 2013-12-11 MED ORDER — HYDROMORPHONE HCL PF 1 MG/ML IJ SOLN
0.2500 mg | INTRAMUSCULAR | Status: DC | PRN
Start: 2013-12-11 — End: 2013-12-11
  Administered 2013-12-11 (×4): 0.5 mg via INTRAVENOUS

## 2013-12-11 MED ORDER — ROCURONIUM BROMIDE 100 MG/10ML IV SOLN
INTRAVENOUS | Status: DC | PRN
  Administered 2013-12-11: 40 mg via INTRAVENOUS

## 2013-12-11 MED ORDER — DEXTROSE-NACL 5-0.9 % IV SOLN
INTRAVENOUS | Status: DC
  Administered 2013-12-11: 100 mL via INTRAVENOUS
  Administered 2013-12-12 (×2): via INTRAVENOUS

## 2013-12-11 MED ORDER — GLYCOPYRROLATE 0.2 MG/ML IJ SOLN
INTRAMUSCULAR | Status: AC
  Filled 2013-12-11: qty 3

## 2013-12-11 MED ORDER — PROPOFOL 10 MG/ML IV BOLUS
INTRAVENOUS | Status: AC
  Filled 2013-12-11: qty 20

## 2013-12-11 MED ORDER — DIPHENHYDRAMINE HCL 50 MG/ML IJ SOLN
12.5000 mg | Freq: Four times a day (QID) | INTRAMUSCULAR | Status: DC | PRN
Start: 2013-12-11 — End: 2013-12-13
  Filled 2013-12-11: qty 0.25

## 2013-12-11 MED ORDER — ACETAMINOPHEN 500 MG PO TABS
1000.0000 mg | ORAL_TABLET | Freq: Four times a day (QID) | ORAL | Status: AC
  Administered 2013-12-11 – 2013-12-12 (×3): 1000 mg via ORAL
  Filled 2013-12-11 (×3): qty 2

## 2013-12-11 MED ORDER — FENTANYL 10 MCG/ML IV SOLN
INTRAVENOUS | Status: DC
  Administered 2013-12-11: 10:00:00 via INTRAVENOUS
  Administered 2013-12-11: 135 ug via INTRAVENOUS
  Administered 2013-12-11: 165 ug/h via INTRAVENOUS
  Administered 2013-12-12: 75 ug via INTRAVENOUS
  Administered 2013-12-12: 150 ug via INTRAVENOUS
  Administered 2013-12-12: 195 ug via INTRAVENOUS
  Administered 2013-12-12: 27.25 ug via INTRAVENOUS
  Administered 2013-12-12: 20:00:00 via INTRAVENOUS
  Administered 2013-12-12: 75 ug via INTRAVENOUS
  Administered 2013-12-12: 120 ug via INTRAVENOUS
  Administered 2013-12-13: 110 ug via INTRAVENOUS
  Administered 2013-12-13: 90 ug via INTRAVENOUS
  Administered 2013-12-13: 45 ug via INTRAVENOUS
  Filled 2013-12-11 (×3): qty 50

## 2013-12-11 MED ORDER — IBUPROFEN 600 MG PO TABS
600.0000 mg | ORAL_TABLET | Freq: Three times a day (TID) | ORAL | Status: DC
  Administered 2013-12-11 – 2013-12-14 (×9): 600 mg via ORAL
  Filled 2013-12-11 (×12): qty 1

## 2013-12-11 MED ORDER — LEVALBUTEROL HCL 0.63 MG/3ML IN NEBU
0.6300 mg | INHALATION_SOLUTION | Freq: Four times a day (QID) | RESPIRATORY_TRACT | Status: DC
  Filled 2013-12-11: qty 3

## 2013-12-11 MED ORDER — MIDAZOLAM HCL 2 MG/2ML IJ SOLN
1.0000 mg | Freq: Once | INTRAMUSCULAR | Status: AC
  Administered 2013-12-11: 1 mg via INTRAVENOUS

## 2013-12-11 MED ORDER — DIPHENHYDRAMINE HCL 12.5 MG/5ML PO ELIX
12.5000 mg | ORAL_SOLUTION | Freq: Four times a day (QID) | ORAL | Status: DC | PRN
  Filled 2013-12-11: qty 5

## 2013-12-11 SURGICAL SUPPLY — 55 items
ATTRACTOMAT 16X20 MAGNETIC DRP (DRAPES) ×2 IMPLANT
BENZOIN TINCTURE PRP APPL 2/3 (GAUZE/BANDAGES/DRESSINGS) ×2 IMPLANT
CANISTER SUCTION 2500CC (MISCELLANEOUS) ×2 IMPLANT
CATH THORACIC 28FR (CATHETERS) IMPLANT
CATH THORACIC 28FR RT ANG (CATHETERS) IMPLANT
CATH THORACIC 36FR (CATHETERS) IMPLANT
CATH THORACIC 36FR RT ANG (CATHETERS) IMPLANT
CLIP TI MEDIUM 6 (CLIP) ×2 IMPLANT
CLIP TI WIDE RED SMALL 6 (CLIP) ×2 IMPLANT
CONN ST 1/4X3/8  BEN (MISCELLANEOUS) ×1
CONN ST 1/4X3/8 BEN (MISCELLANEOUS) ×1 IMPLANT
CONT SPEC 4OZ CLIKSEAL STRL BL (MISCELLANEOUS) ×4 IMPLANT
COVER SURGICAL LIGHT HANDLE (MISCELLANEOUS) ×4 IMPLANT
DERMABOND ADVANCED (GAUZE/BANDAGES/DRESSINGS) ×1
DERMABOND ADVANCED .7 DNX12 (GAUZE/BANDAGES/DRESSINGS) ×1 IMPLANT
DRAIN CHANNEL 28F RND 3/8 FF (WOUND CARE) ×2 IMPLANT
DRAPE LAPAROSCOPIC ABDOMINAL (DRAPES) ×2 IMPLANT
DRAPE SLUSH/WARMER DISC (DRAPES) ×2 IMPLANT
ELECT REM PT RETURN 9FT ADLT (ELECTROSURGICAL) ×2
ELECTRODE REM PT RTRN 9FT ADLT (ELECTROSURGICAL) ×1 IMPLANT
GLOVE BIO SURGEON STRL SZ 6.5 (GLOVE) ×6 IMPLANT
GLOVE BIO SURGEON STRL SZ7 (GLOVE) ×4 IMPLANT
GLOVE BIO SURGEON STRL SZ7.5 (GLOVE) ×2 IMPLANT
GLOVE BIO SURGEON STRL SZ8 (GLOVE) ×2 IMPLANT
GLOVE BIOGEL PI IND STRL 6.5 (GLOVE) ×1 IMPLANT
GLOVE BIOGEL PI IND STRL 7.0 (GLOVE) ×1 IMPLANT
GLOVE BIOGEL PI INDICATOR 6.5 (GLOVE) ×1
GLOVE BIOGEL PI INDICATOR 7.0 (GLOVE) ×1
HEMOSTAT POWDER SURGIFOAM 1G (HEMOSTASIS) IMPLANT
KIT BASIN OR (CUSTOM PROCEDURE TRAY) ×2 IMPLANT
KIT ROOM TURNOVER OR (KITS) ×2 IMPLANT
KIT SUCTION CATH 14FR (SUCTIONS) ×2 IMPLANT
NEEDLE 18GX1X1/2 (RX/OR ONLY) (NEEDLE) ×2 IMPLANT
NS IRRIG 1000ML POUR BTL (IV SOLUTION) ×2 IMPLANT
PACK CHEST (CUSTOM PROCEDURE TRAY) ×2 IMPLANT
PAD ARMBOARD 7.5X6 YLW CONV (MISCELLANEOUS) ×4 IMPLANT
PAD ELECT DEFIB RADIOL ZOLL (MISCELLANEOUS) ×2 IMPLANT
SPONGE GAUZE 4X4 12PLY (GAUZE/BANDAGES/DRESSINGS) ×2 IMPLANT
SPONGE GAUZE 4X4 12PLY STER LF (GAUZE/BANDAGES/DRESSINGS) ×2 IMPLANT
STRIP CLOSURE SKIN 1/2X4 (GAUZE/BANDAGES/DRESSINGS) ×2 IMPLANT
SUT VIC AB 1 CTX 36 (SUTURE) ×1
SUT VIC AB 1 CTX36XBRD ANBCTR (SUTURE) ×1 IMPLANT
SUT VIC AB 3-0 X1 27 (SUTURE) ×2 IMPLANT
SWAB COLLECTION DEVICE MRSA (MISCELLANEOUS) ×4 IMPLANT
SYR 50ML SLIP (SYRINGE) IMPLANT
SYRINGE 10CC LL (SYRINGE) IMPLANT
SYSTEM SAHARA CHEST DRAIN ATS (WOUND CARE) ×2 IMPLANT
TAPE CLOTH SURG 4X10 WHT LF (GAUZE/BANDAGES/DRESSINGS) ×2 IMPLANT
TOWEL OR 17X24 6PK STRL BLUE (TOWEL DISPOSABLE) ×2 IMPLANT
TOWEL OR 17X26 10 PK STRL BLUE (TOWEL DISPOSABLE) ×2 IMPLANT
TRAP SPECIMEN MUCOUS 40CC (MISCELLANEOUS) ×6 IMPLANT
TRAY FOLEY CATH 14FRSI W/METER (CATHETERS) ×2 IMPLANT
TRAY FOLEY CATH 16FRSI W/METER (SET/KITS/TRAYS/PACK) ×2 IMPLANT
TUBE ANAEROBIC SPECIMEN COL (MISCELLANEOUS) ×2 IMPLANT
WATER STERILE IRR 1000ML POUR (IV SOLUTION) ×4 IMPLANT

## 2013-12-11 NOTE — Progress Notes (Signed)
[facility] Progress Note Andrew Harmon - Stepdown/ICU TEAM   Andrew Harmon ZOX:[id number] DOB: 05/10/1986 DOA: 12/10/2013 PCP: Andrew Carnes, MD  Brief narrative: Andrew Harmon is a 28 y.o. male presenting on 12/10/2013 with  has a past medical history of Asthma. who presents with fever, cough and trouble breathing.  Patient states that he was in his usual state of health until 12/02/13 when he developed subacute onset of dyspnea which was worse on lying down. Prior to that he had a couple days of intermittent mild dry cough. He denied any chest pain at that time. The next day, he was seen at an urgent care where flu testing was apparently negative. By this time, patient states that he had a fever of 104F. He was treated for flu and a bacterial infection with a course of Tamiflu and Z-Pak. His cough subsequently got productive of white frothy sputum, dyspnea seemed to get better but still has dyspnea on exertion but orthopnea has improved, developed chest pain which is worse with deep inspiration or coughing, continues to have low-grade fever 99-101F. Due to persisting symptoms, the patient went to the ED where evaluation revealed CT scan with moderate to large pericardial effusion? Pericarditis and patchy opacity anterior inferior left upper lobe compatible with bronchopneumonia. He was transferred to [facility]. His mother had upper respiratory tract symptoms prior to patient getting sick. He recently travel to [place] but no international travel. He has pet dogs-none new. No insect bites or skin rashes.    Subjective: S/p pericardial window- mild pain in chest wall in area of incision otherwise, no complaints.   Assessment/Plan: Principal Problem:   Pericardial effusion- no tamponade - s/p window with 500 ml of fluid removed - inflammatory fluid sent for cultures and viral studies - cont colchicine - on Fentanyl PCA  Active Problems:   CAP - sepsis - cont vanc and cefepime -  check for histoplasma due to trip to [place]     Hypokalemia - replace  Code Status: full code  Family Communication: with mother, aunt and uncle Disposition Plan: home  Consultants: Cardiology CT surgery  Procedures: 2/17 pericardial window  Antibiotics: Vanc and Cefepime 12/11/13  DVT prophylaxis: SCDs  Objective: Filed Weights   12/10/13 1900 12/11/13 0400 12/11/13 1615  Weight: 107.5 kg (236 lb 15.9 oz) 107.6 kg (237 lb 3.4 oz) 108.Harmon kg (238 lb 5.Harmon oz)   Blood pressure 129/65, pulse 92, temperature 98.Harmon F (36.7 C), temperature source Oral, resp. rate 27, height 5\' 10"  (Harmon.778 m), weight 108.Harmon kg (238 lb 5.Harmon oz), SpO2 100.00%.  Intake/Output Summary (Last 24 hours) at 12/11/13 1808 Last data filed at 12/11/13 1751  Gross per 24 hour  Intake   2139 ml  Output   1045 ml  Net   1094 ml     Exam: General: No acute respiratory distress Lungs: Clear to auscultation bilaterally without wheezes or crackles- chest tube draining clear yellow liquid Cardiovascular: Regular rate and rhythm without murmur gallop or rub normal S1 and S2 Abdomen: Nontender, nondistended, soft, bowel sounds positive, no rebound, no ascites, no appreciable mass Extremities: No significant cyanosis, clubbing, or edema bilateral lower extremities  Data Reviewed: Basic Metabolic Panel:  Recent Labs Lab 12/10/13 2204 12/11/13 0716  NA 137 137  K 3.6* 3.6*  CL 96  --   CO2 25  --   GLUCOSE 173*  --   BUN 11  --   CREATININE 0.62  --   CALCIUM 8.8  --  MG 2.4  --    Liver Function Tests:  Recent Labs Lab 12/10/13 2204  AST 20  ALT 52  ALKPHOS 133*  BILITOT 0.7  PROT 8.0  ALBUMIN 2.8*   No results found for this basename: LIPASE, AMYLASE,  in the last 168 hours No results found for this basename: AMMONIA,  in the last 168 hours CBC:  Recent Labs Lab 12/10/13 2204 12/11/13 0716  WBC 14.0*  --   HGB 11.5* 11.2*  HCT 32.3* 33.0*  MCV 85.2  --   PLT 365  --     Cardiac Enzymes: No results found for this basename: CKTOTAL, CKMB, CKMBINDEX, TROPONINI,  in the last 168 hours BNP (last 3 results) No results found for this basename: PROBNP,  in the last 8760 hours CBG: No results found for this basename: GLUCAP,  in the last 168 hours  Recent Results (from the past 240 hour(s))  MRSA PCR SCREENING     Status: None   Collection Time    12/10/13 10:08 PM      Result Value Ref Range Status   MRSA by PCR NEGATIVE  NEGATIVE Final   Comment:            The GeneXpert MRSA Assay (FDA     approved for NASAL specimens     only), is one component of a     comprehensive MRSA colonization     surveillance program. It is not     intended to diagnose MRSA     infection nor to guide or     monitor treatment for     MRSA infections.  CULTURE, EXPECTORATED SPUTUM-ASSESSMENT     Status: None   Collection Time    12/11/13 12:33 AM      Result Value Ref Range Status   Specimen Description SPUTUM   Final   Special Requests NONE   Final   Sputum evaluation     Final   Value: THIS SPECIMEN IS ACCEPTABLE. RESPIRATORY CULTURE REPORT TO FOLLOW.   Report Status 12/11/2013 FINAL   Final  CULTURE, RESPIRATORY (NON-EXPECTORATED)     Status: None   Collection Time    12/11/13 12:33 AM      Result Value Ref Range Status   Specimen Description SPUTUM   Final   Special Requests ADDED 0209   Final   Gram Stain     Final   Value: FEW WBC PRESENT, PREDOMINANTLY PMN     RARE SQUAMOUS EPITHELIAL CELLS PRESENT     FEW GRAM POSITIVE COCCI IN PAIRS     FEW GRAM NEGATIVE RODS     RARE YEAST   Culture PENDING   Incomplete   Report Status PENDING   Incomplete  GRAM STAIN     Status: None   Collection Time    12/11/13  8:34 AM      Result Value Ref Range Status   Specimen Description FLUID PERICARDIAL   Final   Special Requests NO Harmon   Final   Gram Stain     Final   Value: MODERATE WBC PRESENT,BOTH PMN AND MONONUCLEAR     NO ORGANISMS SEEN   Report Status 12/11/2013  FINAL   Final     Studies:  Recent x-ray studies have been reviewed in detail by the Attending Physician  Scheduled Meds:  Scheduled Meds: . acetaminophen  Harmon,000 mg Oral 4 times per day   Or  . acetaminophen (TYLENOL) oral liquid 160 mg/5 mL  Harmon,000 mg  Oral 4 times per day  . bisacodyl  10 mg Oral Daily  . ceFEPime (MAXIPIME) IV  Harmon g Intravenous Q8H  . colchicine  0.6 mg Oral BID  . fentaNYL   Intravenous 6 times per day  . HYDROmorphone      . HYDROmorphone      . ibuprofen  600 mg Oral TID  . levalbuterol  0.63 mg Nebulization Q6H  . midazolam      . vancomycin  Harmon,000 mg Intravenous Q12H   Continuous Infusions: . dextrose 5 % and 0.9% NaCl      Time spent on care of this patient: 35 min   Calvert Cantor, MD  [facility] Office  [phone] Pager - Text Page per Loretha Stapler as per below:  On-Call/Text Page:      [email or web address]      password [email or web address]  If 7PM-7AM, please contact night-coverage [email or web address] Password [email or web address] 12/11/2013, 6:08 PM   LOS: Harmon day

## 2013-12-11 NOTE — Addendum Note (Signed)
Addendum created 12/11/13 1546 by Kipp Brood, MD   Modules edited: Clinical Notes   Clinical Notes:  File: [id number]; Pend: [id number]; Pend: [phone]; Pend: [phone]

## 2013-12-11 NOTE — Transfer of Care (Signed)
Immediate Anesthesia Transfer of Care Note  Patient: Andrew Harmon  Procedure(s) Performed: Procedure(s) with comments: PERICARDIAL WINDOW (N/A) - W/ TEE INTRAOPERATIVE TRANSESOPHAGEAL ECHOCARDIOGRAM (N/A)  Patient Location: PACU  Anesthesia Type:General  Level of Consciousness: awake, alert , oriented and patient cooperative  Airway & Oxygen Therapy: Patient Spontanous Breathing and Patient connected to face mask oxygen  Post-op Assessment: Report given to PACU RN, Post -op Vital signs reviewed and stable and Patient moving all extremities X 4  Post vital signs: Reviewed and stable  Complications: No apparent anesthesia complications

## 2013-12-11 NOTE — Anesthesia Postprocedure Evaluation (Signed)
  Anesthesia Post-op Note  Patient: Andrew Harmon  Procedure(s) Performed: Procedure(s) with comments: PERICARDIAL WINDOW (N/A) - W/ TEE INTRAOPERATIVE TRANSESOPHAGEAL ECHOCARDIOGRAM (N/A)  Patient Location: PACU  Anesthesia Type:General  Level of Consciousness: awake, alert  and oriented  Airway and Oxygen Therapy: Patient Spontanous Breathing and Patient connected to nasal cannula oxygen  Post-op Pain: mild  Post-op Assessment: Post-op Vital signs reviewed, Patient's Cardiovascular Status Stable, Respiratory Function Stable, Patent Airway, No signs of Nausea or vomiting and Pain level controlled  Post-op Vital Signs: stable  Complications: No apparent anesthesia complications

## 2013-12-11 NOTE — Preoperative (Signed)
Beta Blockers   Reason not to administer Beta Blockers:Not Applicable 

## 2013-12-11 NOTE — Anesthesia Preprocedure Evaluation (Addendum)
Anesthesia Evaluation  Patient identified by MRN, date of birth, ID band Patient awake    Reviewed: Allergy & Precautions, H&P , NPO status , Patient's Chart, lab work & pertinent test results  History of Anesthesia Complications Negative for: history of anesthetic complications  Airway Mallampati: II TM Distance: >3 FB Neck ROM: Full    Dental  (+) Teeth Intact, Dental Advisory Given   Pulmonary asthma , pneumonia -, unresolved, Current Smoker,  Childhood asthma; resolved   + decreased breath sounds      Cardiovascular Rhythm:Regular Rate:Tachycardia  Pericardial effusion   Neuro/Psych    GI/Hepatic negative GI ROS, Neg liver ROS,   Endo/Other  Obesity  Renal/GU negative Renal ROS     Musculoskeletal negative musculoskeletal ROS (+)   Abdominal   Peds  Hematology negative hematology ROS (+)   Anesthesia Other Findings   Reproductive/Obstetrics negative OB ROS                          Anesthesia Physical Anesthesia Plan  ASA: IV  Anesthesia Plan: General   Post-op Pain Management:    Induction: Intravenous  Airway Management Planned: Oral ETT  Additional Equipment: Arterial line and CVP  Intra-op Plan:   Post-operative Plan:   Informed Consent: I have reviewed the patients History and Physical, chart, labs and discussed the procedure including the risks, benefits and alternatives for the proposed anesthesia with the patient or authorized representative who has indicated his/her understanding and acceptance.   Dental advisory given  Plan Discussed with: CRNA and Anesthesiologist  Anesthesia Plan Comments: (Symptomatic Pericardial Effusion  Plan GA with art line, central line, TEE  Kipp Brood, MD)        Anesthesia Quick Evaluation

## 2013-12-11 NOTE — Progress Notes (Signed)
Patient Name: Andrew Harmon Date of Encounter: 12/11/2013  Principal Problem:   Pericardial effusion Active Problems:   Fever, unspecified   Leukocytosis, unspecified   CAP (community acquired pneumonia)   Hypokalemia   Pericardial effusion without cardiac tamponade   Length of Stay: 1  SUBJECTIVE  Feels much better after pericardial drainage. Still in PACU - no beds available  CURRENT MEDS . [MAR HOLD] ceFEPime (MAXIPIME) IV  1 g Intravenous Q8H  . [MAR HOLD] Chlorhexidine Gluconate Cloth  6 each Topical Q0600  . colchicine  0.6 mg Oral BID  . fentaNYL   Intravenous 6 times per day  . HYDROmorphone      . HYDROmorphone      . ibuprofen  600 mg Oral TID  . midazolam      . [MAR HOLD] sodium chloride  3 mL Intravenous Q12H  . [facility] HOLD] vancomycin  1,000 mg Intravenous Q12H    OBJECTIVE   Intake/Output Summary (Last 24 hours) at 12/11/13 1505 Last data filed at 12/11/13 1130  Gross per 24 hour  Intake   2139 ml  Output    764 ml  Net   1375 ml   Filed Weights   12/10/13 1900 12/11/13 0400  Weight: 107.5 kg (236 lb 15.9 oz) 107.6 kg (237 lb 3.4 oz)    PHYSICAL EXAM Filed Vitals:   12/11/13 1300 12/11/13 1330 12/11/13 1400 12/11/13 1430  BP: 108/49 117/64 106/62 104/54  Pulse: 83 87 83 85  Temp:      TempSrc:      Resp: 17 22 24 22   Height:      Weight:      SpO2: 96% 98% 96% 97%   General: Alert, oriented x3, no distress Head: no evidence of trauma, PERRL, EOMI, no exophtalmos or lid lag, no myxedema, no xanthelasma; normal ears, nose and oropharynx Neck: normal jugular venous pulsations and no hepatojugular reflux; brisk carotid pulses without delay and no carotid bruits Chest: clear to auscultation, no signs of consolidation by percussion or palpation, normal fremitus, symmetrical and full respiratory excursions Cardiovascular: normal position and quality of the apical impulse, regular rhythm, normal first and second heart sounds, no rubs or  gallops, no murmur Abdomen: no tenderness or distention, no masses by palpation, no abnormal pulsatility or arterial bruits, normal bowel sounds, no hepatosplenomegaly Extremities: no clubbing, cyanosis or edema; 2+ radial, ulnar and brachial pulses bilaterally; 2+ right femoral, posterior tibial and dorsalis pedis pulses; 2+ left femoral, posterior tibial and dorsalis pedis pulses; no subclavian or femoral bruits Neurological: grossly nonfocal  LABS  CBC  Recent Labs  12/10/13 2204 12/11/13 0716  WBC 14.0*  --   HGB 11.5* 11.2*  HCT 32.3* 33.0*  MCV 85.2  --   PLT 365  --    Basic Metabolic Panel  Recent Labs  12/10/13 2204 12/11/13 0716  NA 137 137  K 3.6* 3.6*  CL 96  --   CO2 25  --   GLUCOSE 173*  --   BUN 11  --   CREATININE 0.62  --   CALCIUM 8.8  --   MG 2.4  --    Liver Function Tests  Recent Labs  12/10/13 2204  AST 20  ALT 52  ALKPHOS 133*  BILITOT 0.7  PROT 8.0  ALBUMIN 2.8*   No results found for this basename: LIPASE, AMYLASE,  in the last 72 hours Cardiac Enzymes No results found for this basename: CKTOTAL, CKMB, CKMBINDEX, TROPONINI,  in  the last 72 hours  Radiology Studies Imaging results have been reviewed and Dg Chest Portable 1 View  12/11/2013   CLINICAL DATA:  Pericarditis, subxiphoid pericardial window  EXAM: PORTABLE CHEST - 1 VIEW  COMPARISON:  12/10/2013  FINDINGS: Subxiphoid pericardial tube noted. Cardiac silhouette is enlarged. Low volume exam with vascular congestion and increased and basilar atelectasis. No large effusion or pneumothorax. Trachea midline  IMPRESSION: Status post subxiphoid pericardial tube.  Persistent cardiomegaly  Low volume exam with vascular congestion and increased atelectasis   Electronically Signed   By: Ruel Favorsrevor  Shick M.D.   On: 12/11/2013 09:53    TELE NSR/ST  ECG Diffuse ST elevation  ASSESSMENT AND PLAN Highly inflammatory fluid, albeit not frankly purulent. 63% neutrophils  And very high LDH,  normal glucose. Statistically more likely to be viral, but could be bacterial pericarditis, especially since Rx with antibiotics may have partially suppressed the inflammatory response. [place]. No extrathoracic manifestations to suggest collagen-vascular disease. Reviewed TEE - there were some features of tamponade (diastolic RA collapse). Will follow.   [name], MD, [facility] [phone] office [phone] pager 12/11/2013 3:05 PM

## 2013-12-11 NOTE — Progress Notes (Signed)
  Echocardiogram Echocardiogram Transesophageal has been performed.  Andrew Harmon 12/11/2013, 10:13 AM

## 2013-12-11 NOTE — CV Procedure (Signed)
Intra-operative Transesophageal Echocardiography Report:  Andrew Harmon is a 28 year old male who presented with a nine-day history of fevers, chills, and a productive cough. He was treated with azithromycin as an outpatient with no significant improvement and a CT scan of the chest showed a moderate to large pericardial effusion with a patchy opacity in the left upper lobe. He is now scheduled to undergo pericardial window by Dr. Tyrone Sage. Intra-operative transesophageal echocardiography was requested to confirm the diagnosis, to assess the hemodynamic significance of the effusion, and to assess the adequacy of drainage of the effusion.  The patient was brought to the operating room at [facility] and general anesthesia was induced without difficulty. Following endotracheal intubation and orogastric suctioning, the transesophageal echocardiography probe was inserted into the esophagus without difficulty.  Impression:  1. Pericardial space: There was a moderate to severe pericardial effusion present which was not loculated. The largest dimension of the effusion was in the posterior pericardial space. This measured 3.56 cm. The effusion measured 2.6 cm at the apex. There were small fibrous stransd noted within the effusion. There was a fibrinous mass noted in the lateral aspect of the pericardial space as well.  2. Aortic valve: The aortic valve was trileaflet the leaflets open normally and there was no aortic insufficiency.   3. Mitral valve: The mitral leaflets were thin and pliable and coapted normally and there was no mitral insufficiency noted.  4. Left ventricle: The left ventricular cavity was underfilled and hyperdynamic and there were no regional wall motion abnormalities.   5. Right ventricle: The right ventricular cavity was reduced in size. There was diastolic invagination of the free wall of the right ventricle but no frank diastolic collapse. The right ventricular free wall  appeared to contract normally.  6. Right atrium: The right atrial cavity appeared underfilled and invaginated in diastole but there was no right atrial collapse.Spontaneous echo contrast was noted within the RA cavity, but no thrombus noted.  7. Interatrial septum: There was no evidence of patent foramen ovale or atrial septal defect by color Doppler  8. Left atrium: There was a no thrombus noted in the left atrial appendage or left atrial cavity.  9. Ascending aorta: The ascending aorta appeared normal and measured 2.36 cm in diameter. There was no atheromatous disease or wall thickening noted.  10. Descending aorta: The descending aorta appeared normal in size was no atheromatous disease noted.  Post-drainage Findings:  1. Pericardial space: No residual pericardial effusion could be appreciated.  2. Left ventricle: The left ventricular filling appeared much improved and there was normal contractility in all segments interrogated with an ejection fraction estimated 65%.  3. Right ventricle: The right ventricular cavity showed improved filling and normal contractility the right ventricular free wall and normal right ventricular function.   4. Tricuspid valve: There was trace tricuspid insufficiency.  Kipp Brood, M.D.

## 2013-12-11 NOTE — Progress Notes (Signed)
[address]       [place] [id number]             [phone]                 Day of Surgery Procedure(s) (LRB): PERICARDIAL WINDOW (N/A) INTRAOPERATIVE TRANSESOPHAGEAL ECHOCARDIOGRAM (N/A)  LOS: 1 day   Subjective: No change this am  Objective: Vital signs in last 24 hours: Patient Vitals for the past 24 hrs:  BP Temp Temp src Pulse Resp SpO2 Height Weight  12/11/13 0608 137/68 mmHg 98.5 F (36.9 C) Oral 94 19 94 % - -  12/11/13 0400 125/78 mmHg 98.1 F (36.7 C) Oral 85 21 95 % - 237 lb 3.4 oz (107.6 kg)  12/10/13 2339 134/73 mmHg 98.2 F (36.8 C) Oral 89 33 93 % - -  12/10/13 2300 129/70 mmHg 98 F (36.7 C) Oral 91 25 95 % - -  12/10/13 2100 134/76 mmHg - - 93 22 93 % - -  12/10/13 2046 - - - - - 93 % - -  12/10/13 2029 127/91 mmHg 98.1 F (36.7 C) Oral 91 33 94 % - -  12/10/13 1900 134/90 mmHg 98.2 F (36.8 C) Oral 102 14 96 % 5\' 10"  (1.778 m) 236 lb 15.9 oz (107.5 kg)    Filed Weights   12/10/13 1900 12/11/13 0400  Weight: 236 lb 15.9 oz (107.5 kg) 237 lb 3.4 oz (107.6 kg)    Hemodynamic parameters for last 24 hours:    Intake/Output from previous day: 02/16 0701 - 02/17 0700 In: 539 [P.O.:480; I.V.:59] Out: 400 [Urine:400] Intake/Output this shift:    Scheduled Meds: . [MAR HOLD] ceFEPime (MAXIPIME) IV  1 g Intravenous Q8H  . chlorhexidine      . [MAR HOLD] Chlorhexidine Gluconate Cloth  6 each Topical Q0600  . [MAR HOLD] sodium chloride  3 mL Intravenous Q12H  . [facility] HOLD] vancomycin  1,000 mg Intravenous Q12H   Continuous Infusions: . 0.9 % NaCl with KCl 40 mEq / L 60 mL/hr at 12/10/13 2300   PRN Meds:.0.9 % irrigation (POUR BTL), [MAR HOLD] acetaminophen, [MAR HOLD] acetaminophen, [MAR HOLD] albuterol, [MAR HOLD] ondansetron (ZOFRAN) IV, [MAR HOLD] ondansetron  General appearance: alert, cooperative and mild distress Neurologic: intact Heart: regular rate and rhythm, S1, S2 normal, no murmur, click, rub or gallop Lungs:  diminished breath sounds bibasilar Abdomen: soft, non-tender; bowel sounds normal; no masses,  no organomegaly Extremities: extremities normal, atraumatic, no cyanosis or edema and Homans sign is negative, no sign of DVT No jvd  Lab Results: CBC: Recent Labs  12/10/13 2204  WBC 14.0*  HGB 11.5*  HCT 32.3*  PLT 365   BMET:  Recent Labs  12/10/13 2204  NA 137  K 3.6*  CL 96  CO2 25  GLUCOSE 173*  BUN 11  CREATININE 0.62  CALCIUM 8.8    PT/INR:  Recent Labs  12/10/13 2204  LABPROT 16.1*  INR 1.32     Radiology No results found.   Assessment/Plan: S/P Procedure(s) (LRB): PERICARDIAL WINDOW (N/A) INTRAOPERATIVE TRANSESOPHAGEAL ECHOCARDIOGRAM (N/A)  The goals risks and alternatives of the planned surgical procedure pericardial window to drain and bx pericardiul  have been discussed with the patient in detail. The risks of the procedure including death, infection, stroke, myocardial infarction, bleeding, blood transfusion have all been discussed specifically.  I have quoted Andrew Harmon a 1 % of perioperative mortality and a complication rate as high as  10 %. The patient's questions have been answered.Andrew Harmon is willing  to proceed with the planned procedure.   Delight Ovens MD 12/11/2013 7:21 AM

## 2013-12-11 NOTE — Brief Op Note (Addendum)
12/10/2013 - 12/11/2013  8:50 AM  PATIENT:  Andrew Harmon  28 y.o. male  PRE-OPERATIVE DIAGNOSIS:  PERICARDIAL EFFUSION  POST-OPERATIVE DIAGNOSIS:  PERICARDIAL EFFUSION, 500 ml with evidence of acute pericarditis   PROCEDURE:  Procedure(s): SUB XYPHOID PERICARDIAL WINDOW INTRAOPERATIVE TRANSESOPHAGEAL ECHOCARDIOGRAM  SURGEON:  Surgeon(s): Delight Ovens, MD   PHYSICIAN ASSISTANT: WAYNE GOLD PA-C  ANESTHESIA:   general  PATIENT CONDITION:  PACU - hemodynamically stable.  PRE-OPERATIVE WEIGHT: 107kg  COMPLICATIONS; NO KNOWN  SPECIMENS : CYTOLOGY, HISTOLOGY, CULTURES

## 2013-12-12 ENCOUNTER — Inpatient Hospital Stay (HOSPITAL_COMMUNITY): Payer: BC Managed Care – PPO

## 2013-12-12 DIAGNOSIS — I309 Acute pericarditis, unspecified: Secondary | ICD-10-CM | POA: Diagnosis present

## 2013-12-12 LAB — BLOOD GAS, ARTERIAL
ACID-BASE EXCESS: 3.4 mmol/L — AB (ref 0.0–2.0)
Bicarbonate: 27.2 mEq/L — ABNORMAL HIGH (ref 20.0–24.0)
Drawn by: 36277
O2 Content: 2 L/min
O2 Saturation: 96.6 %
Patient temperature: 98.6
TCO2: 28.5 mmol/L (ref 0–100)
pCO2 arterial: 40.2 mmHg (ref 35.0–45.0)
pH, Arterial: 7.446 (ref 7.350–7.450)
pO2, Arterial: 81.9 mmHg (ref 80.0–100.0)

## 2013-12-12 LAB — BASIC METABOLIC PANEL
BUN: 12 mg/dL (ref 6–23)
CALCIUM: 8.4 mg/dL (ref 8.4–10.5)
CO2: 24 mEq/L (ref 19–32)
Chloride: 100 mEq/L (ref 96–112)
Creatinine, Ser: 0.7 mg/dL (ref 0.50–1.35)
GFR calc Af Amer: >90 mL/min (ref >90)
GFR calc non Af Amer: >90 mL/min (ref >90)
GLUCOSE: 108 mg/dL — AB (ref 70–99)
Potassium: 3.4 mEq/L — ABNORMAL LOW (ref 3.7–5.3)
Sodium: 135 mEq/L — ABNORMAL LOW (ref 137–147)

## 2013-12-12 LAB — CBC
HCT: 31.4 % — ABNORMAL LOW (ref 39.0–52.0)
Hemoglobin: 10.6 g/dL — ABNORMAL LOW (ref 13.0–17.0)
MCH: 29.8 pg (ref 26.0–34.0)
MCHC: 33.8 g/dL (ref 30.0–36.0)
MCV: 88.2 fL (ref 78.0–100.0)
PLATELETS: 374 10*3/uL (ref 150–400)
RBC: 3.56 MIL/uL — ABNORMAL LOW (ref 4.22–5.81)
RDW: 13.5 % (ref 11.5–15.5)
WBC: 12.2 10*3/uL — AB (ref 4.0–10.5)

## 2013-12-12 MED ORDER — LEVALBUTEROL HCL 0.63 MG/3ML IN NEBU: 0.6300 mg | INHALATION_SOLUTION | Freq: Four times a day (QID) | RESPIRATORY_TRACT | Status: DC | PRN

## 2013-12-12 MED ORDER — SODIUM CHLORIDE 0.9 % IV SOLN
INTRAVENOUS | Status: DC
  Administered 2013-12-12: 16:00:00 via INTRAVENOUS

## 2013-12-12 MED ORDER — ACETAMINOPHEN 325 MG PO TABS: 650.0000 mg | ORAL_TABLET | Freq: Four times a day (QID) | ORAL | Status: DC | PRN

## 2013-12-12 MED ORDER — POTASSIUM CHLORIDE CRYS ER 20 MEQ PO TBCR
40.0000 meq | EXTENDED_RELEASE_TABLET | Freq: Once | ORAL | Status: AC
  Administered 2013-12-12: 40 meq via ORAL
  Filled 2013-12-12: qty 2

## 2013-12-12 MED ORDER — PANTOPRAZOLE SODIUM 40 MG PO TBEC
40.0000 mg | DELAYED_RELEASE_TABLET | Freq: Every day | ORAL | Status: DC
  Administered 2013-12-13 – 2013-12-14 (×2): 40 mg via ORAL
  Filled 2013-12-12 (×2): qty 1

## 2013-12-12 NOTE — Progress Notes (Signed)
Progress Note [place] TEAM 1 - Stepdown/ICU TEAM   Andrew Harmon CBU:[id number] DOB: [birth year] DOA: 12/10/2013 PCP: Quincy Carnes, MD  Brief narrative:  28 y.o. male admitted on 12/10/2013 with a history of only asthma who presented with fever, cough and trouble breathing.  Patient stated that he was in his usual state of health until 12/02/13 when he developed onset of dyspnea which was worse on lying down.  He was seen at an Urgent Care where flu testing was negative. He developed a fever of 104F. He was treated for flu and a bacterial infection with a course of Tamiflu and Z-Pak. His dyspnea initially seemed to get better but dyspnea on exertion persisted.  He developed chest pain which was worse with deep inspiration and continued to have low-grade fever 99-101F. Due to persisting symptoms, the patient went to the ED where CT scan revealed a moderate to large pericardial effusion anda patchy opacity anterior inferior left upper lobe compatible with bronchopneumonia. He was transferred to [facility].   Subjective: Pt is resting comfortably.  Only modest pain associated w/ mediastinal chest tube.  Denies sob, n/v, or abdom pain.   Assessment/Plan:  Pericardial effusion - acute pericarditis  - s/p window with 500 ml of fluid removed - ongoing care as per TCTS - inflammatory fluid sent for cultures and viral studies - cont colchicine + Fentanyl PCA   CAP w/ Sepsis  - cont vanc and cefepime for empiric coverage  - check for histoplasma due to trip to [place]  - legionella and strep screens negative  - clinically improving, w/ near resolution of sepsis physiology   Hypokalemia  - replace - check Mg   Code Status: FULL Family Communication: with pt and mother at bedside  Disposition Plan: home  Consultants: Cardiology CT surgery  Procedures: 2/17 pericardial window w/ TEE  Antibiotics: Vanc 2/16 >> Cefepime 2/16 >>  DVT prophylaxis: SCDs  Objective: Filed Weights     12/10/13 1900 12/11/13 0400 12/11/13 1615  Weight: 107.5 kg (236 lb 15.9 oz) 107.6 kg (237 lb 3.4 oz) 108.1 kg (238 lb 5.1 oz)   Blood pressure 95/62, pulse 70, temperature 98.3 F (36.8 C), temperature source Oral, resp. rate 23, height 5\' 10"  (1.778 m), weight 108.1 kg (238 lb 5.1 oz), SpO2 95.00%.  Intake/Output Summary (Last 24 hours) at 12/12/13 1131 Last data filed at 12/12/13 0700  Gross per 24 hour  Intake   1600 ml  Output   1197 ml  Net    403 ml   Exam: General: No acute respiratory distress at rest in bed  Lungs: Clear to auscultation bilaterally without wheezes or crackles - chest tube draining clear yellow liquid Cardiovascular: Regular rate and rhythm without murmur gallop or rub - occasional sinus tachycardia noted during exam  Abdomen: Nontender, nondistended, soft, bowel sounds positive, no rebound, no ascites, no appreciable mass Extremities: No significant cyanosis, clubbing, or edema bilateral lower extremities  Data Reviewed: Basic Metabolic Panel:  Recent Labs Lab 12/10/13 2204 12/11/13 0716 12/12/13 0254  NA 137 137 135*  K 3.6* 3.6* 3.4*  CL 96  --  100  CO2 25  --  24  GLUCOSE 173*  --  108*  BUN 11  --  12  CREATININE 0.62  --  0.70  CALCIUM 8.8  --  8.4  MG 2.4  --   --    Liver Function Tests:  Recent Labs Lab 12/10/13 2204  AST 20  ALT 52  ALKPHOS 133*  BILITOT 0.7  PROT 8.0  ALBUMIN 2.8*   CBC:  Recent Labs Lab 12/10/13 2204 12/11/13 0716 12/12/13 0254  WBC 14.0*  --  12.2*  HGB 11.5* 11.2* 10.6*  HCT 32.3* 33.0* 31.4*  MCV 85.2  --  88.2  PLT 365  --  374    Recent Results (from the past 240 hour(s))  CULTURE, BLOOD (ROUTINE X 2)     Status: None   Collection Time    12/10/13 10:04 PM      Result Value Ref Range Status   Specimen Description BLOOD RIGHT ARM   Final   Special Requests BOTTLES DRAWN AEROBIC ONLY 4CC   Final   Culture  Setup Time     Final   Value: 12/11/2013 03:44     Performed at [facility]   Culture     Final   Value:        BLOOD CULTURE RECEIVED NO GROWTH TO DATE CULTURE WILL BE HELD FOR 5 DAYS BEFORE ISSUING A FINAL NEGATIVE REPORT     Performed at [facility]   Report Status PENDING   Incomplete  MRSA PCR SCREENING     Status: None   Collection Time    12/10/13 10:08 PM      Result Value Ref Range Status   MRSA by PCR NEGATIVE  NEGATIVE Final   Comment:            The GeneXpert MRSA Assay (FDA     approved for NASAL specimens     only), is one component of a     comprehensive MRSA colonization     surveillance program. It is not     intended to diagnose MRSA     infection nor to guide or     monitor treatment for     MRSA infections.  CULTURE, BLOOD (ROUTINE X 2)     Status: None   Collection Time    12/10/13 10:17 PM      Result Value Ref Range Status   Specimen Description BLOOD RIGHT HAND   Final   Special Requests BOTTLES DRAWN AEROBIC ONLY 1CC   Final   Culture  Setup Time     Final   Value: 12/11/2013 03:44     Performed at [facility]   Culture     Final   Value:        BLOOD CULTURE RECEIVED NO GROWTH TO DATE CULTURE WILL BE HELD FOR 5 DAYS BEFORE ISSUING A FINAL NEGATIVE REPORT     Performed at [facility]   Report Status PENDING   Incomplete  CULTURE, EXPECTORATED SPUTUM-ASSESSMENT     Status: None   Collection Time    12/11/13 12:33 AM      Result Value Ref Range Status   Specimen Description SPUTUM   Final   Special Requests NONE   Final   Sputum evaluation     Final   Value: THIS SPECIMEN IS ACCEPTABLE. RESPIRATORY CULTURE REPORT TO FOLLOW.   Report Status 12/11/2013 FINAL   Final  CULTURE, RESPIRATORY (NON-EXPECTORATED)     Status: None   Collection Time    12/11/13 12:33 AM      Result Value Ref Range Status   Specimen Description SPUTUM   Final   Special Requests ADDED 0209   Final   Gram Stain     Final   Value: FEW WBC PRESENT, PREDOMINANTLY PMN  RARE SQUAMOUS EPITHELIAL CELLS PRESENT      FEW GRAM POSITIVE COCCI IN PAIRS     FEW GRAM NEGATIVE RODS     RARE YEAST   Culture     Final   Value: NORMAL OROPHARYNGEAL FLORA     Performed at [facility]   Report Status PENDING   Incomplete  BODY FLUID CULTURE     Status: None   Collection Time    12/11/13  8:30 AM      Result Value Ref Range Status   Specimen Description FLUID PERICARDIAL   Final   Special Requests NO 3   Final   Gram Stain     Final   Value: RARE WBC PRESENT, PREDOMINANTLY PMN     NO ORGANISMS SEEN     Performed at [facility]   Culture PENDING   Incomplete   Report Status PENDING   Incomplete  BODY FLUID CULTURE     Status: None   Collection Time    12/11/13  8:34 AM      Result Value Ref Range Status   Specimen Description FLUID PERICARDIAL   Final   Special Requests NO 1   Final   Gram Stain     Final   Value: MODERATE WBC PRESENT,BOTH PMN AND MONONUCLEAR     NO ORGANISMS SEEN     Performed at [facility]     Performed at [facility]   Culture PENDING   Incomplete   Report Status PENDING   Incomplete  FUNGUS CULTURE W SMEAR     Status: None   Collection Time    12/11/13  8:34 AM      Result Value Ref Range Status   Specimen Description FLUID PERICARDIAL   Final   Special Requests NO 1   Final   Fungal Smear     Final   Value: NO YEAST OR FUNGAL ELEMENTS SEEN     Performed at [facility]   Culture     Final   Value: CULTURE IN PROGRESS FOR FOUR WEEKS     Performed at [facility]   Report Status PENDING   Incomplete  GRAM STAIN     Status: None   Collection Time    12/11/13  8:34 AM      Result Value Ref Range Status   Specimen Description FLUID PERICARDIAL   Final   Special Requests NO 1   Final   Gram Stain     Final   Value: MODERATE WBC PRESENT,BOTH PMN AND MONONUCLEAR     NO ORGANISMS SEEN   Report Status 12/11/2013 FINAL   Final  ANAEROBIC CULTURE     Status: None   Collection Time    12/11/13  8:37 AM      Result Value Ref  Range Status   Specimen Description TISSUE PERICARDIAL   Final   Special Requests NO 3   Final   Gram Stain PENDING   Incomplete   Culture     Final   Value: NO ANAEROBES ISOLATED; CULTURE IN PROGRESS FOR 5 DAYS     Performed at [facility]   Report Status PENDING   Incomplete  TISSUE CULTURE     Status: None   Collection Time    12/11/13  8:37 AM      Result Value Ref Range Status   Specimen Description TISSUE PERICARDIAL   Final   Special Requests NO 3   Final  Gram Stain PENDING   Incomplete   Culture     Final   Value: NO GROWTH 1 DAY     [facility]   Report Status PENDING   Incomplete  VIRAL CULTURE VIRC     Status: None   Collection Time    12/11/13  8:39 AM      Result Value Ref Range Status   Specimen Description FLUID PERICARDIAL   Final   Special Requests NONE   Final   Culture     Final   Value: Culture has been initiated.     Note:    The current Enterovirus culture system does not detect Enterovirus D68. If Enterovirus D68 is suspected, please call client services to add the test for Enterovirus PCR (Test code [id number]).     Performed at [facility]   Report Status PENDING   Incomplete  VIRAL CULTURE VIRC     Status: None   Collection Time    12/11/13  8:43 AM      Result Value Ref Range Status   Specimen Description TISSUE PERICARDIAL   Final   Special Requests NONE   Final   Culture     Final   Value: Culture has been initiated.     Note:    The current Enterovirus culture system does not detect Enterovirus D68. If Enterovirus D68 is suspected, please call client services to add the test for Enterovirus PCR (Test code [id number]).     [facility]formed at [facility]   Report Status PENDING   Incomplete     Studies:  Recent x-ray studies have been reviewed in detail by the Attending Physician  Scheduled Meds:  Scheduled Meds: . acetaminophen  1,000 mg Oral 4 times per day   Or  . acetaminophen (TYLENOL) oral liquid  160 mg/5 mL  1,000 mg Oral 4 times per day  . bisacodyl  10 mg Oral Daily  . ceFEPime (MAXIPIME) IV  1 g Intravenous Q8H  . colchicine  0.6 mg Oral BID  . fentaNYL   Intravenous 6 times per day  . ibuprofen  600 mg Oral TID  . levalbuterol  0.63 mg Nebulization TID  . vancomycin  1,000 mg Intravenous Q12H    Time spent on care of this patient: 35 min   Jasir Rother T, MD  [facility] Office  [phone] Pager - Text Page per Loretha StaplerAmion as per below:  On-Call/Text Page:      [email or web address]      password [email or web address]  If 7PM-7AM, please contact night-coverage [email or web address] Password [email or web address] 12/12/2013, 11:31 AM   LOS: 2 days

## 2013-12-12 NOTE — Progress Notes (Signed)
Patient Name: Andrew Harmon Date of Encounter: 12/12/2013   Principal Problem:   Pericardial effusion Active Problems:   Acute pericarditis, unspecified   Fever, unspecified   CAP (community acquired pneumonia)   Leukocytosis, unspecified   Hypokalemia    SUBJECTIVE  S/p pericardial window yesterday.  Still with pleuritic chest pain but overall feeling better.  No significant dyspnea.  CURRENT MEDS . acetaminophen  1,000 mg Oral 4 times per day   Or  . acetaminophen (TYLENOL) oral liquid 160 mg/5 mL  1,000 mg Oral 4 times per day  . bisacodyl  10 mg Oral Daily  . ceFEPime (MAXIPIME) IV  1 g Intravenous Q8H  . colchicine  0.6 mg Oral BID  . fentaNYL   Intravenous 6 times per day  . ibuprofen  600 mg Oral TID  . levalbuterol  0.63 mg Nebulization TID  . vancomycin  1,000 mg Intravenous Q12H   OBJECTIVE  Filed Vitals:   12/12/13 0431 12/12/13 0800 12/12/13 0901 12/12/13 1214  BP:      Pulse:  87    Temp:  98.1 F (36.7 C)  99.9 F (37.7 C)  TempSrc:  Oral  Oral  Resp: 20 23    Height:      Weight:      SpO2: 99% 96% 95%     Intake/Output Summary (Last 24 hours) at 12/12/13 1222 Last data filed at 12/12/13 1200  Gross per 24 hour  Intake 2384.16 ml  Output   1217 ml  Net 1167.16 ml   Filed Weights   12/10/13 1900 12/11/13 0400 12/11/13 1615  Weight: 236 lb 15.9 oz (107.5 kg) 237 lb 3.4 oz (107.6 kg) 238 lb 5.1 oz (108.1 kg)   PHYSICAL EXAM  General: Pleasant, NAD. Neuro: Alert and oriented X 3. Moves all extremities spontaneously. Psych: Normal affect. HEENT:  Normal  Neck: Supple without bruits or JVD. Lungs:  Resp regular and unlabored, diminished breath sounds bilat - poor effort 2/2 pleuritic c/p. Heart: RRR no s3, s4, rub, or murmurs. Abdomen: Soft, non-tender, non-distended, BS + x 4.  Extremities: No clubbing, cyanosis or edema. DP/PT/Radials 2+ and equal bilaterally.  Accessory Clinical Findings  CBC  Recent Labs  12/10/13 2204  12/11/13 0716 12/12/13 0254  WBC 14.0*  --  12.2*  HGB 11.5* 11.2* 10.6*  HCT 32.3* 33.0* 31.4*  MCV 85.2  --  88.2  PLT 365  --  374   Basic Metabolic Panel  Recent Labs  12/10/13 2204 12/11/13 0716 12/12/13 0254  NA 137 137 135*  K 3.6* 3.6* 3.4*  CL 96  --  100  CO2 25  --  24  GLUCOSE 173*  --  108*  BUN 11  --  12  CREATININE 0.62  --  0.70  CALCIUM 8.8  --  8.4  MG 2.4  --   --    Liver Function Tests  Recent Labs  12/10/13 2204  AST 20  ALT 52  ALKPHOS 133*  BILITOT 0.7  PROT 8.0  ALBUMIN 2.8*   TELE  rsr  ECG  2/17 - rsr, 89, diffuse ST elevation  Radiology/Studies  Dg Chest Port 1 View  12/12/2013   CLINICAL DATA:  Pericardial drain placement  EXAM: PORTABLE CHEST - 1 VIEW  COMPARISON:  None.  FINDINGS: Subxiphoid pericardial drainage catheter is unchanged in position. There is less air in the pericardial region compared to 1 day prior. There is prominence of the cardiac silhouette, stable.  There  is patchy consolidation in both lung bases. Lungs are otherwise clear. No pneumothorax. No adenopathy.  IMPRESSION: Stable subxiphoid pericardial drainage catheter position. No pneumothorax. Less pericardial air compared to 1 day prior. There is patchy infiltrate in the medial lung bases. Lungs are otherwise clear. Cardiac silhouette prominent stable.   Electronically Signed   By: Bretta BangWilliam  Woodruff M.D.   On: 12/12/2013 07:34   ASSESSMENT AND PLAN  1. Acute pericarditis/Pericardial effusion:  Recent viral illness with subsequent dyspnea and pleuritic chest pain.  CT showed large pericardial effusion/LUL PNA, ECG with diffuse ST elevation.  S/P pericardial window yesterday.  Overall feeling better but cont to have pleuritic chest pain and chest wall soreness. Viral culture pending; Tissue culture NGTD.  Pericardial fluid with 1800 WBC, 63% neutrophils.  Cont anti-inflammatory therapy - ibuprofen/colchicine.  Add PPI.  2.  CAP:  Abx per IM.  3.  Normocytic  Anemia:  Stable.  4.  Hypokalemia:  supp.  Signed, Nicolasa Duckinghristopher Berge NP  I have seen and examined the patient along with Nicolasa Duckinghristopher Berge NP.  I have reviewed the chart, notes and new data.  I agree with PA/NP's note.  Roughly 200 mL fluid drained since initial evacuation (roughly 30 hours) All smears/cultures unrevealing so far. Repeat limited echo 24h after chest tube removed.  Thurmon FairMihai Neeley Sedivy, MD, [facility]FACC [facility] [phone] 12/12/2013, 2:25 PM

## 2013-12-12 NOTE — Op Note (Signed)
[name], ASAI NO.:  [id number]  MEDICAL RECORD NO.:  [id number]  LOCATION:  3S11C                        FACILITY:  [facility]  PHYSICIAN:  Sheliah Plane, MD    DATE OF BIRTH:  11-Oct-1986  DATE OF PROCEDURE:  12/11/2013 DATE OF DISCHARGE:                              OPERATIVE REPORT   PREOPERATIVE DIAGNOSIS:  Large pericardial effusion.  POSTOPERATIVE DIAGNOSIS:  Large pericardial effusion with evidence of pericarditis.  PROCEDURE PERFORMED:  Subxiphoid pericardial window with drainage of pericardial effusion and TEE.  SURGEON:  Sheliah Plane, MD  FIRST ASSISTANT:  Rowe Clack, PA-C  BRIEF HISTORY:  The patient is a 28 year old male with a 10-day history of fever, chills, sweats.  He has been treated with antibiotics and Tamiflu but without resolution of his symptoms.  Because of continued symptoms, he went to the [facility] Emergency Room and then was transferred to [facility].  CT scan showed evidence of a large pericardial effusion.  For therapeutic and diagnostic reasons, it was recommended to him that we proceed with subxiphoid pericardial window to drain pericardial effusion and to obtain a tissue and cultures.  The patient agreed and signed informed consent.  DESCRIPTION OF PROCEDURE:  The patient underwent general endotracheal anesthesia without incident.  Dr. Arita Miss placed a TEE probe.  This confirmed our preoperative diagnosis of a large pericardial effusion, with preserved left ventricular function and normal valves.  There was a large pericardial effusion with some areas of debris in the pericardial fluid.  After appropriate time-out was performed and the patient was prepped and draped in a sterile manner, a small incision was made over the subxiphoid process with slight elevation in the pericardium easily identified and opened.  A 500 mL of cloudy but nonbloody pericardial fluid was drained.  There were some fragments of probable  proteinaceous debris that was visualized in the inferior recess of the pericardium which was removed and pericardial window of anterior pericardium was graded.  A portion of this was sent for histology.  The fluid and the proteinaceous debris in the fluid was submitted both for cultures and histology including viral cultures.  The pericardium did not appear thickened but was definitely inflamed.  A TEE showed complete resolution of the fluid.  A 28 Blake drain was left inferiorly in the pericardium and brought out through a separate site on the upper abdominal wall. The incision was then closed with interrupted 0 Vicryl, running 2-0 Vicryl in subcutaneous tissue, and 3-0 subcuticular stitch in the skin edges.  Dermabond was placed.  The patient was awakened and extubated in the operating room and transferred to the recovery room for further postoperative care having tolerated the procedure without complications. Sponge and needle count was reported as correct at the completion of procedure.  Blood loss was minimal.     Sheliah Plane, MD     EG/MEDQ  D:  12/12/2013  T:  12/12/2013  Job:  [id number]

## 2013-12-12 NOTE — Progress Notes (Addendum)
[address]       [place] [id number]             [phone]          1 Day Post-Op Procedure(s) (LRB): PERICARDIAL WINDOW (N/A) INTRAOPERATIVE TRANSESOPHAGEAL ECHOCARDIOGRAM (N/A)  Subjective: OOB in chair, already walked this am.  Sore, but pain controlled with PCA.   Objective: Vital signs in last 24 hours: Patient Vitals for the past 24 hrs:  BP Temp Temp src Pulse Resp SpO2 Height Weight  12/12/13 0800 - - - - 23 96 % - -  12/12/13 0431 - - - - 20 99 % - -  12/12/13 0400 95/62 mmHg 98.3 F (36.8 C) Oral 70 22 98 % - -  12/12/13 0000 104/51 mmHg 98 F (36.7 C) Oral 77 24 96 % - -  12/11/13 1957 124/71 mmHg 99 F (37.2 C) Oral 98 30 96 % - -  12/11/13 1947 - - - - 22 98 % - -  12/11/13 1615 129/65 mmHg 98.1 F (36.7 C) Oral 92 27 100 % 5\' 10"  (1.778 m) 238 lb 5.1 oz (108.1 kg)  12/11/13 1530 118/64 mmHg 98.4 F (36.9 C) - 84 21 98 % - -  12/11/13 1500 120/53 mmHg - - 82 23 97 % - -  12/11/13 1430 104/54 mmHg - - 85 22 97 % - -  12/11/13 1400 106/62 mmHg - - 83 24 96 % - -  12/11/13 1330 117/64 mmHg - - 87 22 98 % - -  12/11/13 1300 108/49 mmHg - - 83 17 96 % - -  12/11/13 1230 115/54 mmHg - - 91 20 94 % - -  12/11/13 1200 - - - 90 22 95 % - -  12/11/13 1130 108/57 mmHg 97.7 F (36.5 C) - 97 26 96 % - -  12/11/13 1030 125/47 mmHg - - 96 24 94 % - -  12/11/13 1015 118/60 mmHg - - 99 20 95 % - -  12/11/13 1003 - - - - 19 96 % - -  12/11/13 1000 112/60 mmHg - - 95 21 96 % - -  12/11/13 0945 117/60 mmHg - - 88 23 97 % - -  12/11/13 0930 118/57 mmHg - - 84 18 96 % - -  12/11/13 0917 136/62 mmHg 98.3 F (36.8 C) - 86 18 92 % - -   Current Weight  12/11/13 238 lb 5.1 oz (108.1 kg)     Intake/Output from previous day: 02/17 0701 - 02/18 0700 In: 3200 [I.V.:2900; IV Piggyback:300] Out: 1561 [Urine:1375; Blood:100; Chest Tube:86]    PHYSICAL EXAM:  Heart: RRR Lungs: Few exp wheezes Wound: Clean and dry     Lab  Results: CBC: Recent Labs  12/10/13 2204 12/11/13 0716 12/12/13 0254  WBC 14.0*  --  12.2*  HGB 11.5* 11.2* 10.6*  HCT 32.3* 33.0* 31.4*  PLT 365  --  374   BMET:  Recent Labs  12/10/13 2204 12/11/13 0716 12/12/13 0254  NA 137 137 135*  K 3.6* 3.6* 3.4*  CL 96  --  100  CO2 25  --  24  GLUCOSE 173*  --  108*  BUN 11  --  12  CREATININE 0.62  --  0.70  CALCIUM 8.8  --  8.4    PT/INR:  Recent Labs  12/10/13 2204  LABPROT 16.1*  INR 1.32   Sputum cx: FEW GRAM POSITIVE COCCI IN  PAIRS FEW GRAM NEGATIVE RODS RARE YEAST  Fluid cx's negative so far  CXR:  FINDINGS:  Subxiphoid pericardial drainage catheter is unchanged in position.  There is less air in the pericardial region compared to 1 day prior.  There is prominence of the cardiac silhouette, stable.  There is patchy consolidation in both lung bases. Lungs are  otherwise clear. No pneumothorax. No adenopathy.  IMPRESSION:  Stable subxiphoid pericardial drainage catheter position. No  pneumothorax. Less pericardial air compared to 1 day prior. There is  patchy infiltrate in the medial lung bases. Lungs are otherwise  clear. Cardiac silhouette prominent stable.    Assessment/Plan: S/P Procedure(s) (LRB): PERICARDIAL WINDOW (N/A) INTRAOPERATIVE TRANSESOPHAGEAL ECHOCARDIOGRAM (N/A) CT output low.  Hopefully can place CT to water seal today. Continue ambulation, pulm toilet. Decrease IVF, advance diet, routine POD#1 progression. Intraop cx's negative so far, path pending. Continue current care per primary service.   LOS: 2 days    COLLINS,GINA H 12/12/2013   Chart reviewed, patient examined, agree with above. Will put pericardial tube to water seal and should be able to remove it tomorrow.

## 2013-12-12 NOTE — Progress Notes (Signed)
Utilization review completed.  

## 2013-12-13 ENCOUNTER — Inpatient Hospital Stay (HOSPITAL_COMMUNITY): Payer: BC Managed Care – PPO

## 2013-12-13 DIAGNOSIS — E876 Hypokalemia: Secondary | ICD-10-CM

## 2013-12-13 LAB — TSH: TSH: 2.736 u[IU]/mL (ref 0.350–4.500)

## 2013-12-13 LAB — COMPREHENSIVE METABOLIC PANEL
ALBUMIN: 2.2 g/dL — AB (ref 3.5–5.2)
ALT: 82 U/L — ABNORMAL HIGH (ref 0–53)
AST: 39 U/L — ABNORMAL HIGH (ref 0–37)
Alkaline Phosphatase: 121 U/L — ABNORMAL HIGH (ref 39–117)
BUN: 8 mg/dL (ref 6–23)
CO2: 25 mEq/L (ref 19–32)
CREATININE: 0.66 mg/dL (ref 0.50–1.35)
Calcium: 8 mg/dL — ABNORMAL LOW (ref 8.4–10.5)
Chloride: 100 mEq/L (ref 96–112)
GFR calc Af Amer: >90 mL/min (ref >90)
GFR calc non Af Amer: >90 mL/min (ref >90)
Glucose, Bld: 93 mg/dL (ref 70–99)
Potassium: 3.3 mEq/L — ABNORMAL LOW (ref 3.7–5.3)
Sodium: 138 mEq/L (ref 137–147)
Total Bilirubin: 0.3 mg/dL (ref 0.3–1.2)
Total Protein: 6.5 g/dL (ref 6.0–8.3)

## 2013-12-13 LAB — CBC
HCT: 34.7 % — ABNORMAL LOW (ref 39.0–52.0)
Hemoglobin: 11.7 g/dL — ABNORMAL LOW (ref 13.0–17.0)
MCH: 29.7 pg (ref 26.0–34.0)
MCHC: 33.7 g/dL (ref 30.0–36.0)
MCV: 88.1 fL (ref 78.0–100.0)
Platelets: 401 10*3/uL — ABNORMAL HIGH (ref 150–400)
RBC: 3.94 MIL/uL — ABNORMAL LOW (ref 4.22–5.81)
RDW: 13.4 % (ref 11.5–15.5)
WBC: 9.6 10*3/uL (ref 4.0–10.5)

## 2013-12-13 LAB — CULTURE, RESPIRATORY: CULTURE: NORMAL

## 2013-12-13 LAB — VANCOMYCIN, TROUGH: VANCOMYCIN TR: 5.6 ug/mL — AB (ref 10.0–20.0)

## 2013-12-13 LAB — CULTURE, RESPIRATORY W GRAM STAIN

## 2013-12-13 MED ORDER — OXYCODONE-ACETAMINOPHEN 5-325 MG PO TABS
1.0000 | ORAL_TABLET | ORAL | Status: DC | PRN
  Administered 2013-12-13 (×2): 2 via ORAL
  Administered 2013-12-14: 1 via ORAL
  Administered 2013-12-14: 2 via ORAL
  Filled 2013-12-13: qty 2
  Filled 2013-12-13: qty 1
  Filled 2013-12-13 (×2): qty 2

## 2013-12-13 MED ORDER — VANCOMYCIN HCL 10 G IV SOLR
1250.0000 mg | Freq: Four times a day (QID) | INTRAVENOUS | Status: DC
  Administered 2013-12-13 – 2013-12-14 (×4): 1250 mg via INTRAVENOUS
  Filled 2013-12-13 (×6): qty 1250

## 2013-12-13 MED ORDER — TRAMADOL HCL 50 MG PO TABS
50.0000 mg | ORAL_TABLET | Freq: Four times a day (QID) | ORAL | Status: DC | PRN
  Administered 2013-12-14: 50 mg via ORAL
  Filled 2013-12-13: qty 2

## 2013-12-13 NOTE — Progress Notes (Signed)
Patient Name: Andrew Harmon Date of Encounter: 12/13/2013  Principal Problem:   Pericardial effusion- S/P pericardial window 12/11/13 Active Problems:   Fever, unspecified   Leukocytosis, unspecified   CAP (community acquired pneumonia)   Hypokalemia   Acute pericarditis, unspecified   Length of Stay: 3  SUBJECTIVE  Reduced drainage. Chest tube to be pulled  CURRENT MEDS . bisacodyl  10 mg Oral Daily  . ceFEPime (MAXIPIME) IV  1 g Intravenous Q8H  . colchicine  0.6 mg Oral BID  . ibuprofen  600 mg Oral TID  . pantoprazole  40 mg Oral Q0600  . vancomycin  1,000 mg Intravenous Q12H    OBJECTIVE   Intake/Output Summary (Last 24 hours) at 12/13/13 1303 Last data filed at 12/13/13 1224  Gross per 24 hour  Intake 2333.33 ml  Output   2100 ml  Net 233.33 ml   Filed Weights   12/11/13 0400 12/11/13 1615 12/13/13 0500  Weight: 107.6 kg (237 lb 3.4 oz) 108.1 kg (238 lb 5.1 oz) 107.2 kg (236 lb 5.3 oz)    PHYSICAL EXAM Filed Vitals:   12/13/13 0500 12/13/13 0752 12/13/13 0800 12/13/13 1200  BP:   129/69 131/74  Pulse:      Temp:   98.4 F (36.9 C) 98.6 F (37 C)  TempSrc:   Axillary Oral  Resp:  22    Height:      Weight: 107.2 kg (236 lb 5.3 oz)     SpO2:       General: Alert, oriented x3, no distress Head: no evidence of trauma, PERRL, EOMI, no exophtalmos or lid lag, no myxedema, no xanthelasma; normal ears, nose and oropharynx Neck: normal jugular venous pulsations and no hepatojugular reflux; brisk carotid pulses without delay and no carotid bruits Chest: clear to auscultation, no signs of consolidation by percussion or palpation, normal fremitus, symmetrical and full respiratory excursions Cardiovascular: normal position and quality of the apical impulse, regular rhythm, normal first and second heart sounds, no rubs or gallops, no murmur Abdomen: no tenderness or distention, no masses by palpation, no abnormal pulsatility or arterial bruits, normal bowel  sounds, no hepatosplenomegaly Extremities: no clubbing, cyanosis or edema; 2+ radial, ulnar and brachial pulses bilaterally; 2+ right femoral, posterior tibial and dorsalis pedis pulses; 2+ left femoral, posterior tibial and dorsalis pedis pulses; no subclavian or femoral bruits Neurological: grossly nonfocal  LABS  CBC  Recent Labs  12/12/13 0254 12/13/13 0234  WBC 12.2* 9.6  HGB 10.6* 11.7*  HCT 31.4* 34.7*  MCV 88.2 88.1  PLT 374 401*   Basic Metabolic Panel  Recent Labs  12/10/13 2204  12/12/13 0254 12/13/13 0234  NA 137  < > 135* 138  K 3.6*  < > 3.4* 3.3*  CL 96  --  100 100  CO2 25  --  24 25  GLUCOSE 173*  --  108* 93  BUN 11  --  12 8  CREATININE 0.62  --  0.70 0.66  CALCIUM 8.8  --  8.4 8.0*  MG 2.4  --   --   --   < > = values in this interval not displayed. Liver Function Tests  Recent Labs  12/10/13 2204 12/13/13 0234  AST 20 39*  ALT 52 82*  ALKPHOS 133* 121*  BILITOT 0.7 0.3  PROT 8.0 6.5  ALBUMIN 2.8* 2.2*    Radiology Studies Imaging results have been reviewed and Dg Chest Port 1 View  12/13/2013   CLINICAL DATA:  Pericardial  effusion. , status post pericardial window creation  EXAM: PORTABLE CHEST - 1 VIEW  COMPARISON:  DG CHEST 1V PORT dated 12/12/2013; DG CHEST 1V PORT dated 12/11/2013; CT CHEST W/O CM dated 12/10/2013  FINDINGS: Nedra HaiLee lungs are borderline hypoinflated but stable. The left hemidiaphragm remains obscured. The interstitial markings on the left are mildly increased follow on the right appear relatively stable. The cardiopericardial silhouette remains enlarged. The subxiphoid drainage catheter is again demonstrated and appears unchanged with the tip at the T9 level. No definite pericardial sac gas is demonstrated today.  IMPRESSION: There has not been dramatic interval change in the appearance of the chest since yesterday's study. Mild interstitial edema is likely present. The cardiopericardial silhouette remains enlarged.    Electronically Signed   By: Diamond  [place]   On: 12/13/2013 07:31   Dg Chest Port 1 View  12/12/2013   CLINICAL DATA:  Pericardial drain placement  EXAM: PORTABLE CHEST - 1 VIEW  COMPARISON:  None.  FINDINGS: Subxiphoid pericardial drainage catheter is unchanged in position. There is less air in the pericardial region compared to 1 day prior. There is prominence of the cardiac silhouette, stable.  There is patchy consolidation in both lung bases. Lungs are otherwise clear. No pneumothorax. No adenopathy.  IMPRESSION: Stable subxiphoid pericardial drainage catheter position. No pneumothorax. Less pericardial air compared to 1 day prior. There is patchy infiltrate in the medial lung bases. Lungs are otherwise clear. Cardiac silhouette prominent stable.   Electronically Signed   By: Bretta BangWilliam  Woodruff M.D.   On: 12/12/2013 07:34    All cultures and smears from fluid are negative to date.  Pathology with nonspecific inflammatory pattern  ASSESSMENT AND PLAN Would continue indomethacin with gastric protection and colchicine for 2 weeks and then stop. F/U limited echo in 2 weeks then see in office. Can use sed rate to monitor resolution of inflammatory process.   Thurmon FairMihai Fergus Throne, MD, [facility] [phone] office [phone] pager 12/13/2013 1:03 PM

## 2013-12-13 NOTE — Progress Notes (Addendum)
       [address]       [place] [address]             [phone]          2 Days Post-Op Procedure(s) (LRB): PERICARDIAL WINDOW (N/A) INTRAOPERATIVE TRANSESOPHAGEAL ECHOCARDIOGRAM (N/A)  Subjective: +productive cough, otherwise feeling okay.   Objective: Vital signs in last 24 hours: Patient Vitals for the past 24 hrs:  BP Temp Temp src Pulse Resp SpO2 Weight  12/13/13 0752 - - - - 22 - -  12/13/13 0500 - - - - - - 236 lb 5.3 oz (107.2 kg)  12/13/13 0329 104/91 mmHg 98.6 F (37 C) Oral 84 - 97 % -  12/12/13 2323 129/67 mmHg 100.2 F (37.9 C) Oral 93 27 95 % -  12/12/13 2000 - 98.4 F (36.9 C) Oral - - - -  12/12/13 1941 122/69 mmHg - - 88 21 96 % -  12/12/13 1600 - - - - 19 98 % -  12/12/13 1516 - - - 88 17 97 % -  12/12/13 1515 117/63 mmHg - - 86 24 96 % -  12/12/13 1514 117/63 mmHg 98 F (36.7 C) Oral 85 26 95 % -  12/12/13 1214 117/65 mmHg 99.9 F (37.7 C) Oral - - - -  12/12/13 1200 - - - - 21 94 % -  12/12/13 0901 - - - - - 95 % -   Current Weight  12/13/13 236 lb 5.3 oz (107.2 kg)     Intake/Output from previous day: 02/18 0701 - 02/19 0700 In: 2757.5 [P.O.:1320; I.V.:1037.5; IV Piggyback:400] Out: 910 [Urine:800; Chest Tube:110]    PHYSICAL EXAM:  Heart: RRR Lungs: Coarse rhonchi bilaterally Wound: Clean and dry    Lab Results: CBC: Recent Labs  12/12/13 0254 12/13/13 0234  WBC 12.2* 9.6  HGB 10.6* 11.7*  HCT 31.4* 34.7*  PLT 374 401*   BMET:  Recent Labs  12/12/13 0254 12/13/13 0234  NA 135* 138  K 3.4* 3.3*  CL 100 100  CO2 24 25  GLUCOSE 108* 93  BUN 12 8  CREATININE 0.70 0.66  CALCIUM 8.4 8.0*    PT/INR:  Recent Labs  12/10/13 2204  LABPROT 16.1*  INR 1.32    CXR: FINDINGS:  Lee lungs are borderline hypoinflated but stable. The left  hemidiaphragm remains obscured. The interstitial markings on the  left are mildly increased follow on the right appear relatively  stable. The  cardiopericardial silhouette remains enlarged. The  subxiphoid drainage catheter is again demonstrated and appears  unchanged with the tip at the T9 level. No definite pericardial sac  gas is demonstrated today.  IMPRESSION:  There has not been dramatic interval change in the appearance of the  chest since yesterday's study. Mild interstitial edema is likely  present. The cardiopericardial silhouette remains enlarged.   Assessment/Plan: S/P Procedure(s) (LRB): PERICARDIAL WINDOW (N/A) INTRAOPERATIVE TRANSESOPHAGEAL ECHOCARDIOGRAM (N/A) Pericardial drain has put out 110 ml over past 24 hours and none over the past 12.  Plan d/c CT today. Intraoperative cultures negative so far. Continue NSAIDS, colchicine per cardiology. Hypokalemia- replace K+. Continue current care per primary service.    LOS: 3 days    COLLINS,GINA H 12/13/2013  Patient seen and examined, agree with above Dc pericardial drain

## 2013-12-13 NOTE — Progress Notes (Signed)
ANTIBIOTIC CONSULT NOTE - FOLLOW UP  Pharmacy Consult for Vancomycin and Cefepime Indication: pneumonia  Allergies  Allergen Reactions  . Amoxicillin Hives  . Penicillins     Per his doctor   Dosing weight:  107 KG  Temp:  98.6 F (37 C) (Oral)    Labs:  Recent Labs  12/10/13 2204 12/11/13 0716 12/12/13 0254 12/13/13 0234  WBC 14.0*  --  12.2* 9.6  HGB 11.5* 11.2* 10.6* 11.7*  PLT 365  --  374 401*  CREATININE 0.62  --  0.70 0.66   Estimated Creatinine Clearance: 171.6 ml/min (by C-G formula based on Cr of 0.66).  Recent Labs  12/13/13 0922  VANCOTROUGH 5.6*     Microbiology: Recent Results (from the past 720 hour(s))  CULTURE, BLOOD (ROUTINE X 2)     Status: None   Collection Time    12/10/13 10:04 PM      Result Value Ref Range Status   Specimen Description BLOOD RIGHT ARM   Final   Special Requests BOTTLES DRAWN AEROBIC ONLY 4CC   Final   Culture  Setup Time     Final   Value: 12/11/2013 03:44     [facility]   Culture     Final   Value:        BLOOD CULTURE RECEIVED NO GROWTH TO DATE CULTURE WILL BE HELD FOR 5 DAYS BEFORE ISSUING A FINAL NEGATIVE REPORT     [facility]formed at [facility]   Report Status PENDING   Incomplete  MRSA PCR SCREENING     Status: None   Collection Time    12/10/13 10:08 PM      Result Value Ref Range Status   MRSA by PCR NEGATIVE  NEGATIVE Final   Comment:            The GeneXpert MRSA Assay (FDA     approved for NASAL specimens     only), is one component of a     comprehensive MRSA colonization     surveillance program. It is not     intended to diagnose MRSA     infection nor to guide or     monitor treatment for     MRSA infections.  CULTURE, BLOOD (ROUTINE X 2)     Status: None   Collection Time    12/10/13 10:17 PM      Result Value Ref Range Status   Specimen Description BLOOD RIGHT HAND   Final   Special Requests BOTTLES DRAWN AEROBIC ONLY 1CC   Final   Culture  Setup Time      Final   Value: 12/11/2013 03:44     Performed at [facility]   Culture     Final   Value:        BLOOD CULTURE RECEIVED NO GROWTH TO DATE CULTURE WILL BE HELD FOR 5 DAYS BEFORE ISSUING A FINAL NEGATIVE REPORT     Performed at [facility]   Report Status PENDING   Incomplete  CULTURE, EXPECTORATED SPUTUM-ASSESSMENT     Status: None   Collection Time    12/11/13 12:33 AM      Result Value Ref Range Status   Specimen Description SPUTUM   Final   Special Requests NONE   Final   Sputum evaluation     Final   Value: THIS SPECIMEN IS ACCEPTABLE. RESPIRATORY CULTURE REPORT TO FOLLOW.   Report Status 12/11/2013 FINAL   Final  CULTURE,  RESPIRATORY (NON-EXPECTORATED)     Status: None   Collection Time    12/11/13 12:33 AM      Result Value Ref Range Status   Specimen Description SPUTUM   Final   Special Requests ADDED 0209   Final   Gram Stain     Final   Value: FEW WBC PRESENT, PREDOMINANTLY PMN     RARE SQUAMOUS EPITHELIAL CELLS PRESENT     FEW GRAM POSITIVE COCCI IN PAIRS     FEW GRAM NEGATIVE RODS     RARE YEAST   Culture     Final   Value: NORMAL OROPHARYNGEAL FLORA     Performed at [facility]   Report Status 12/13/2013 FINAL   Final  BODY FLUID CULTURE     Status: None   Collection Time    12/11/13  8:30 AM      Result Value Ref Range Status   Specimen Description FLUID PERICARDIAL   Final   Special Requests NO 3   Final   Gram Stain     Final   Value: RARE WBC PRESENT, PREDOMINANTLY PMN     NO ORGANISMS SEEN     Performed at [facility]   Culture     Final   Value: NO GROWTH 2 DAYS     Performed at [facility]   Report Status PENDING   Incomplete  BODY FLUID CULTURE     Status: None   Collection Time    12/11/13  8:34 AM      Result Value Ref Range Status   Specimen Description FLUID PERICARDIAL   Final   Special Requests NO 1   Final   Gram Stain     Final   Value: MODERATE WBC PRESENT,BOTH PMN AND MONONUCLEAR     NO  ORGANISMS SEEN     Performed at [facility]     Performed at [facility]   Culture     Final   Value: NO GROWTH 2 DAYS     Performed at [facility]   Report Status PENDING   Incomplete  AFB CULTURE WITH SMEAR     Status: None   Collection Time    12/11/13  8:34 AM      Result Value Ref Range Status   Specimen Description FLUID PERICARDIAL   Final   Special Requests NO 1   Final   ACID FAST SMEAR     Final   Value: NO ACID FAST BACILLI SEEN     Performed at [facility]   Culture     Final   Value: CULTURE WILL BE EXAMINED FOR 6 WEEKS BEFORE ISSUING A FINAL REPORT     Performed at [facility]   Report Status PENDING   Incomplete  FUNGUS CULTURE W SMEAR     Status: None   Collection Time    12/11/13  8:34 AM      Result Value Ref Range Status   Specimen Description FLUID PERICARDIAL   Final   Special Requests NO 1   Final   Fungal Smear     Final   Value: NO YEAST OR FUNGAL ELEMENTS SEEN     Performed at [facility]   Culture     Final   Value: CULTURE IN PROGRESS FOR FOUR WEEKS     Performed at [facility]   Report Status PENDING   Incomplete  GRAM STAIN  Status: None   Collection Time    12/11/13  8:34 AM      Result Value Ref Range Status   Specimen Description FLUID PERICARDIAL   Final   Special Requests NO 1   Final   Gram Stain     Final   Value: MODERATE WBC PRESENT,BOTH PMN AND MONONUCLEAR     NO ORGANISMS SEEN   Report Status 12/11/2013 FINAL   Final  ANAEROBIC CULTURE     Status: None   Collection Time    12/11/13  8:37 AM      Result Value Ref Range Status   Specimen Description TISSUE PERICARDIAL   Final   Special Requests NO 3   Final   Gram Stain PENDING   Incomplete   Culture     Final   Value: NO ANAEROBES ISOLATED; CULTURE IN PROGRESS FOR 5 DAYS     Performed at [facility]   Report Status PENDING   Incomplete  TISSUE CULTURE     Status: None   Collection Time    12/11/13   8:37 AM      Result Value Ref Range Status   Specimen Description TISSUE PERICARDIAL   Final   Special Requests NO 3   Final   Gram Stain     Final   Value: NO WBC SEEN     NO ORGANISMS SEEN     Performed at [facility]   Culture     Final   Value: NO GROWTH 2 DAYS     Performed at [facility]   Report Status PENDING   Incomplete  VIRAL CULTURE VIRC     Status: None   Collection Time    12/11/13  8:39 AM      Result Value Ref Range Status   Specimen Description FLUID PERICARDIAL   Final   Special Requests NONE   Final   Culture     Final   Value: Culture has been initiated.     Note:    The current Enterovirus culture system does not detect Enterovirus D68. If Enterovirus D68 is suspected, please call client services to add the test for Enterovirus PCR (Test code 16109).     Performed at [facility]   Report Status PENDING   Incomplete  VIRAL CULTURE VIRC     Status: None   Collection Time    12/11/13  8:43 AM      Result Value Ref Range Status   Specimen Description TISSUE PERICARDIAL   Final   Special Requests NONE   Final   Culture     Final   Value: Culture has been initiated.     Note:    The current Enterovirus culture system does not detect Enterovirus D68. If Enterovirus D68 is suspected, please call client services to add the test for Enterovirus PCR (Test code 60454).     Performed at [facility]   Report Status PENDING   Incomplete    Anti-infectives   Start     Dose/Rate Route Frequency Ordered Stop   12/11/13 1000  vancomycin (VANCOCIN) IVPB 1000 mg/200 mL premix     1,000 mg 200 mL/hr over 60 Minutes Intravenous Every 12 hours 12/10/13 2203     12/11/13 0100  ceFEPIme (MAXIPIME) 1 g in dextrose 5 % 50 mL IVPB     1 g 100 mL/hr over 30 Minutes Intravenous Every 8 hours 12/10/13 2051 12/19/13 0559  Assessment:  Day # 3 of Vancomycin and Cefepime for PNA.    Afebrile.  Last WBC 9.6.  Vancomycin trough  significantly below therapeutic goal for PNA coverage.  Vanc trough 5.6 mcg/ml.  All doses given and charted appropriately.  Patient is clearing Vancomycin faster than predicted.  Goal of Therapy:  Vancomycin trough level 15-20 mcg/ml  Plan:  1. Continue Cefepime as ordered, 1 gm IV q 8 hours. 2. Change Vancomycin to 1250 mg IV q 6 hours. 3. Will recheck Vancomycin trough around 4th dose, 12/14/13. 4. Follow up SCr, UOP, cultures, clinical course and adjust as clinically indicated.   Velda Shell J 12/13/2013,1:24 PM

## 2013-12-13 NOTE — Progress Notes (Signed)
PATIENT DETAILS Name: Andrew Harmon Age: 28 y.o. Sex: male Date of Birth: 1985/12/02 Admit Date: 12/10/2013 Admitting Physician Leroy Sea, MD DGU:[id number], Humberto Leep, MD  Subjective: No major complaints, and glad that chest tube has been removed.  Assessment/Plan: Principal Problem:   Pericardial effusion- S/P pericardial window 12/11/13 - Secondary to acute pericarditis, pericardial fluid nonspecific inflammatory pattern. - Continue colchicine and ibuprofen-cardiology recommending 2 weeks - Chest tube discontinued on 2/19, cardiothoracic surgery and cardiology following  CAP w/ Sepsis  - cont vanc and cefepime for empiric coverage  - check for histoplasma due to trip to [facility]  - legionella and strep screens negative  - clinically improving, w/ near resolution of sepsis physiology   Hypokalemia  - replace - check Mg   Disposition: Remain inpatient-remain in SDU as Chest tube just removed  DVT Prophylaxis: SCD's  Code Status: Full code   Family Communication None at bedside  Procedures: 2/17 pericardial window w/ TEE   CONSULTS:  cardiology and Cardiothoracic surgery   MEDICATIONS: Scheduled Meds: . bisacodyl  10 mg Oral Daily  . ceFEPime (MAXIPIME) IV  1 g Intravenous Q8H  . colchicine  0.6 mg Oral BID  . ibuprofen  600 mg Oral TID  . pantoprazole  40 mg Oral Q0600  . vancomycin  1,000 mg Intravenous Q12H   Continuous Infusions:  PRN Meds:.acetaminophen, levalbuterol, oxyCODONE-acetaminophen, potassium chloride, senna-docusate, traMADol  Antibiotics: Anti-infectives   Start     Dose/Rate Route Frequency Ordered Stop   12/11/13 1000  vancomycin (VANCOCIN) IVPB 1000 mg/200 mL premix     1,000 mg 200 mL/hr over 60 Minutes Intravenous Every 12 hours 12/10/13 2203     12/11/13 0100  ceFEPIme (MAXIPIME) 1 g in dextrose 5 % 50 mL IVPB     1 g 100 mL/hr over 30 Minutes Intravenous Every 8 hours 12/10/13 2051 12/19/13 0559   12/10/13  2230  vancomycin (VANCOCIN) 2,250 mg in sodium chloride 0.9 % 500 mL IVPB     2,250 mg 250 mL/hr over 120 Minutes Intravenous  Once 12/10/13 2149 12/11/13 0230       PHYSICAL EXAM: Vital signs in last 24 hours: Filed Vitals:   12/13/13 0500 12/13/13 0752 12/13/13 0800 12/13/13 1200  BP:   129/69 131/74  Pulse:      Temp:   98.4 F (36.9 C) 98.6 F (37 C)  TempSrc:   Axillary Oral  Resp:  22    Height:      Weight: 107.2 kg (236 lb 5.3 oz)     SpO2:        Weight change: -0.9 kg (-1 lb 15.8 oz) Filed Weights   12/11/13 0400 12/11/13 1615 12/13/13 0500  Weight: 107.6 kg (237 lb 3.4 oz) 108.1 kg (238 lb 5.1 oz) 107.2 kg (236 lb 5.3 oz)   Body mass index is 33.91 kg/(m^2).   Gen Exam: Awake and alert with clear speech.   Neck: Supple, No JVD.   Chest: B/L Clear.   CVS: S1 S2 Regular, no murmurs.  Abdomen: soft, BS +, non tender, non distended.  Extremities: no edema, lower extremities warm to touch. Neurologic: Non Focal.   Skin: No Rash.   Wounds: N/A.   Intake/Output from previous day:  Intake/Output Summary (Last 24 hours) at 12/13/13 1311 Last data filed at 12/13/13 1224  Gross per 24 hour  Intake 2333.33 ml  Output   2100 ml  Net 233.33 ml  LAB RESULTS: CBC  Recent Labs Lab 12/10/13 2204 12/11/13 0716 12/12/13 0254 12/13/13 0234  WBC 14.0*  --  12.2* 9.6  HGB 11.5* 11.2* 10.6* 11.7*  HCT 32.3* 33.0* 31.4* 34.7*  PLT 365  --  374 401*  MCV 85.2  --  88.2 88.1  MCH 30.3  --  29.8 29.7  MCHC 35.6  --  33.8 33.7  RDW 13.0  --  13.5 13.4    Chemistries   Recent Labs Lab 12/10/13 2204 12/11/13 0716 12/12/13 0254 12/13/13 0234  NA 137 137 135* 138  K 3.6* 3.6* 3.4* 3.3*  CL 96  --  100 100  CO2 25  --  24 25  GLUCOSE 173*  --  108* 93  BUN 11  --  12 8  CREATININE 0.62  --  0.70 0.66  CALCIUM 8.8  --  8.4 8.0*  MG 2.4  --   --   --     CBG: No results found for this basename: GLUCAP,  in the last 168 hours  GFR Estimated  Creatinine Clearance: 171.6 ml/min (by C-G formula based on Cr of 0.66).  Coagulation profile  Recent Labs Lab 12/10/13 2204  INR 1.32    Cardiac Enzymes No results found for this basename: CK, CKMB, TROPONINI, MYOGLOBIN,  in the last 168 hours  No components found with this basename: POCBNP,  No results found for this basename: DDIMER,  in the last 72 hours No results found for this basename: HGBA1C,  in the last 72 hours No results found for this basename: CHOL, HDL, LDLCALC, TRIG, CHOLHDL, LDLDIRECT,  in the last 72 hours  Recent Labs  12/13/13 0234  TSH 2.736   No results found for this basename: VITAMINB12, FOLATE, FERRITIN, TIBC, IRON, RETICCTPCT,  in the last 72 hours No results found for this basename: LIPASE, AMYLASE,  in the last 72 hours  Urine Studies No results found for this basename: UACOL, UAPR, USPG, UPH, UTP, UGL, UKET, UBIL, UHGB, UNIT, UROB, ULEU, UEPI, UWBC, URBC, UBAC, CAST, CRYS, UCOM, BILUA,  in the last 72 hours  MICROBIOLOGY: Recent Results (from the past 240 hour(s))  CULTURE, BLOOD (ROUTINE X 2)     Status: None   Collection Time    12/10/13 10:04 PM      Result Value Ref Range Status   Specimen Description BLOOD RIGHT ARM   Final   Special Requests BOTTLES DRAWN AEROBIC ONLY 4CC   Final   Culture  Setup Time     Final   Value: 12/11/2013 03:44     Performed at [facility]   Culture     Final   Value:        BLOOD CULTURE RECEIVED NO GROWTH TO DATE CULTURE WILL BE HELD FOR 5 DAYS BEFORE ISSUING A FINAL NEGATIVE REPORT     Performed at [facility]   Report Status PENDING   Incomplete  MRSA PCR SCREENING     Status: None   Collection Time    12/10/13 10:08 PM      Result Value Ref Range Status   MRSA by PCR NEGATIVE  NEGATIVE Final   Comment:            The GeneXpert MRSA Assay (FDA     approved for NASAL specimens     only), is one component of a     comprehensive MRSA colonization     surveillance program. It is not  intended to diagnose MRSA     infection nor to guide or     monitor treatment for     MRSA infections.  CULTURE, BLOOD (ROUTINE X 2)     Status: None   Collection Time    12/10/13 10:17 PM      Result Value Ref Range Status   Specimen Description BLOOD RIGHT HAND   Final   Special Requests BOTTLES DRAWN AEROBIC ONLY 1CC   Final   Culture  Setup Time     Final   Value: 12/11/2013 03:44     Performed at [facility]   Culture     Final   Value:        BLOOD CULTURE RECEIVED NO GROWTH TO DATE CULTURE WILL BE HELD FOR 5 DAYS BEFORE ISSUING A FINAL NEGATIVE REPORT     Performed at [facility]   Report Status PENDING   Incomplete  CULTURE, EXPECTORATED SPUTUM-ASSESSMENT     Status: None   Collection Time    12/11/13 12:33 AM      Result Value Ref Range Status   Specimen Description SPUTUM   Final   Special Requests NONE   Final   Sputum evaluation     Final   Value: THIS SPECIMEN IS ACCEPTABLE. RESPIRATORY CULTURE REPORT TO FOLLOW.   Report Status 12/11/2013 FINAL   Final  CULTURE, RESPIRATORY (NON-EXPECTORATED)     Status: None   Collection Time    12/11/13 12:33 AM      Result Value Ref Range Status   Specimen Description SPUTUM   Final   Special Requests ADDED 0209   Final   Gram Stain     Final   Value: FEW WBC PRESENT, PREDOMINANTLY PMN     RARE SQUAMOUS EPITHELIAL CELLS PRESENT     FEW GRAM POSITIVE COCCI IN PAIRS     FEW GRAM NEGATIVE RODS     RARE YEAST   Culture     Final   Value: NORMAL OROPHARYNGEAL FLORA     Performed at [facility]   Report Status 12/13/2013 FINAL   Final  BODY FLUID CULTURE     Status: None   Collection Time    12/11/13  8:30 AM      Result Value Ref Range Status   Specimen Description FLUID PERICARDIAL   Final   Special Requests NO 3   Final   Gram Stain     Final   Value: RARE WBC PRESENT, PREDOMINANTLY PMN     NO ORGANISMS SEEN     Performed at [facility]   Culture     Final   Value: NO GROWTH 2  DAYS     Performed at [facility]   Report Status PENDING   Incomplete  BODY FLUID CULTURE     Status: None   Collection Time    12/11/13  8:34 AM      Result Value Ref Range Status   Specimen Description FLUID PERICARDIAL   Final   Special Requests NO 1   Final   Gram Stain     Final   Value: MODERATE WBC PRESENT,BOTH PMN AND MONONUCLEAR     NO ORGANISMS SEEN     Performed at [facility]     Performed at [facility]   Culture     Final   Value: NO GROWTH 2 DAYS     Performed at [facility]   Report Status  PENDING   Incomplete  AFB CULTURE WITH SMEAR     Status: None   Collection Time    12/11/13  8:34 AM      Result Value Ref Range Status   Specimen Description FLUID PERICARDIAL   Final   Special Requests NO 1   Final   ACID FAST SMEAR     Final   Value: NO ACID FAST BACILLI SEEN     Performed at [facility]   Culture     Final   Value: CULTURE WILL BE EXAMINED FOR 6 WEEKS BEFORE ISSUING A FINAL REPORT     Performed at [facility]   Report Status PENDING   Incomplete  FUNGUS CULTURE W SMEAR     Status: None   Collection Time    12/11/13  8:34 AM      Result Value Ref Range Status   Specimen Description FLUID PERICARDIAL   Final   Special Requests NO 1   Final   Fungal Smear     Final   Value: NO YEAST OR FUNGAL ELEMENTS SEEN     Performed at [facility]   Culture     Final   Value: CULTURE IN PROGRESS FOR FOUR WEEKS     Performed at [facility]   Report Status PENDING   Incomplete  GRAM STAIN     Status: None   Collection Time    12/11/13  8:34 AM      Result Value Ref Range Status   Specimen Description FLUID PERICARDIAL   Final   Special Requests NO 1   Final   Gram Stain     Final   Value: MODERATE WBC PRESENT,BOTH PMN AND MONONUCLEAR     NO ORGANISMS SEEN   Report Status 12/11/2013 FINAL   Final  ANAEROBIC CULTURE     Status: None   Collection Time    12/11/13  8:37 AM      Result  Value Ref Range Status   Specimen Description TISSUE PERICARDIAL   Final   Special Requests NO 3   Final   Gram Stain PENDING   Incomplete   Culture     Final   Value: NO ANAEROBES ISOLATED; CULTURE IN PROGRESS FOR 5 DAYS     Performed at [facility]   Report Status PENDING   Incomplete  TISSUE CULTURE     Status: None   Collection Time    12/11/13  8:37 AM      Result Value Ref Range Status   Specimen Description TISSUE PERICARDIAL   Final   Special Requests NO 3   Final   Gram Stain     Final   Value: NO WBC SEEN     NO ORGANISMS SEEN     Performed at [facility]   Culture     Final   Value: NO GROWTH 2 DAYS     Performed at [facility]   Report Status PENDING   Incomplete  VIRAL CULTURE VIRC     Status: None   Collection Time    12/11/13  8:39 AM      Result Value Ref Range Status   Specimen Description FLUID PERICARDIAL   Final   Special Requests NONE   Final   Culture     Final   Value: Culture has been initiated.     Note:    The current Enterovirus culture system does not detect Enterovirus D68.  If Enterovirus D68 is suspected, please call client services to add the test for Enterovirus PCR (Test code [id number]).     Performed at [facility]   Report Status PENDING   Incomplete  VIRAL CULTURE VIRC     Status: None   Collection Time    12/11/13  8:43 AM      Result Value Ref Range Status   Specimen Description TISSUE PERICARDIAL   Final   Special Requests NONE   Final   Culture     Final   Value: Culture has been initiated.     Note:    The current Enterovirus culture system does not detect Enterovirus D68. If Enterovirus D68 is suspected, please call client services to add the test for Enterovirus PCR (Test code [id number]).     Performed at [facility]   Report Status PENDING   Incomplete    RADIOLOGY STUDIES/RESULTS: Dg Chest Port 1 View  12/13/2013   CLINICAL DATA:  Pericardial effusion. , status post pericardial window  creation  EXAM: PORTABLE CHEST - 1 VIEW  COMPARISON:  DG CHEST 1V PORT dated 12/12/2013; DG CHEST 1V PORT dated 12/11/2013; CT CHEST W/O CM dated 12/10/2013  FINDINGS: Nedra HaiLee lungs are borderline hypoinflated but stable. The left hemidiaphragm remains obscured. The interstitial markings on the left are mildly increased follow on the right appear relatively stable. The cardiopericardial silhouette remains enlarged. The subxiphoid drainage catheter is again demonstrated and appears unchanged with the tip at the T9 level. No definite pericardial sac gas is demonstrated today.  IMPRESSION: There has not been dramatic interval change in the appearance of the chest since yesterday's study. Mild interstitial edema is likely present. The cardiopericardial silhouette remains enlarged.   Electronically Signed   By: Stancil  SwazilandJordan   On: 12/13/2013 07:31   Dg Chest Port 1 View  12/12/2013   CLINICAL DATA:  Pericardial drain placement  EXAM: PORTABLE CHEST - 1 VIEW  COMPARISON:  None.  FINDINGS: Subxiphoid pericardial drainage catheter is unchanged in position. There is less air in the pericardial region compared to 1 day prior. There is prominence of the cardiac silhouette, stable.  There is patchy consolidation in both lung bases. Lungs are otherwise clear. No pneumothorax. No adenopathy.  IMPRESSION: Stable subxiphoid pericardial drainage catheter position. No pneumothorax. Less pericardial air compared to 1 day prior. There is patchy infiltrate in the medial lung bases. Lungs are otherwise clear. Cardiac silhouette prominent stable.   Electronically Signed   By: Bretta BangWilliam  Woodruff M.D.   On: 12/12/2013 07:34   Dg Chest Portable 1 View  12/11/2013   CLINICAL DATA:  Pericarditis, subxiphoid pericardial window  EXAM: PORTABLE CHEST - 1 VIEW  COMPARISON:  12/10/2013  FINDINGS: Subxiphoid pericardial tube noted. Cardiac silhouette is enlarged. Low volume exam with vascular congestion and increased and basilar atelectasis. No large  effusion or pneumothorax. Trachea midline  IMPRESSION: Status post subxiphoid pericardial tube.  Persistent cardiomegaly  Low volume exam with vascular congestion and increased atelectasis   Electronically Signed   By: Ruel Favorsrevor  Shick M.D.   On: 12/11/2013 09:53    Jeoffrey MassedGHIMIRE,Pessy Delamar, MD  [facility] Pager:[phone]  If 7PM-7AM, please contact night-coverage [email or web address] Password [email or web address] 12/13/2013, 1:11 PM   LOS: 3 days

## 2013-12-14 ENCOUNTER — Inpatient Hospital Stay (HOSPITAL_COMMUNITY): Payer: BC Managed Care – PPO

## 2013-12-14 ENCOUNTER — Telehealth: Payer: Self-pay | Admitting: Cardiovascular Disease

## 2013-12-14 ENCOUNTER — Other Ambulatory Visit: Payer: Self-pay | Admitting: Cardiology

## 2013-12-14 ENCOUNTER — Encounter (HOSPITAL_COMMUNITY): Payer: Self-pay | Admitting: Cardiothoracic Surgery

## 2013-12-14 DIAGNOSIS — I309 Acute pericarditis, unspecified: Secondary | ICD-10-CM

## 2013-12-14 DIAGNOSIS — I313 Pericardial effusion (noninflammatory): Secondary | ICD-10-CM

## 2013-12-14 DIAGNOSIS — I3139 Other pericardial effusion (noninflammatory): Secondary | ICD-10-CM

## 2013-12-14 LAB — BODY FLUID CULTURE
Culture: NO GROWTH
Culture: NO GROWTH

## 2013-12-14 LAB — CBC
HCT: 35.9 % — ABNORMAL LOW (ref 39.0–52.0)
HEMOGLOBIN: 12.4 g/dL — AB (ref 13.0–17.0)
MCH: 30.3 pg (ref 26.0–34.0)
MCHC: 34.5 g/dL (ref 30.0–36.0)
MCV: 87.8 fL (ref 78.0–100.0)
Platelets: 400 10*3/uL (ref 150–400)
RBC: 4.09 MIL/uL — ABNORMAL LOW (ref 4.22–5.81)
RDW: 13.2 % (ref 11.5–15.5)
WBC: 8.7 10*3/uL (ref 4.0–10.5)

## 2013-12-14 LAB — BASIC METABOLIC PANEL
BUN: 8 mg/dL (ref 6–23)
CHLORIDE: 103 meq/L (ref 96–112)
CO2: 25 mEq/L (ref 19–32)
Calcium: 8.3 mg/dL — ABNORMAL LOW (ref 8.4–10.5)
Creatinine, Ser: 0.62 mg/dL (ref 0.50–1.35)
GFR calc non Af Amer: >90 mL/min (ref >90)
Glucose, Bld: 87 mg/dL (ref 70–99)
POTASSIUM: 3.6 meq/L — AB (ref 3.7–5.3)
SODIUM: 140 meq/L (ref 137–147)

## 2013-12-14 LAB — TISSUE CULTURE
Culture: NO GROWTH
Gram Stain: NONE SEEN

## 2013-12-14 MED ORDER — PANTOPRAZOLE SODIUM 40 MG PO TBEC: 40.0000 mg | DELAYED_RELEASE_TABLET | Freq: Every day | ORAL | Status: DC

## 2013-12-14 MED ORDER — COLCHICINE 0.6 MG PO TABS: 0.6000 mg | ORAL_TABLET | Freq: Two times a day (BID) | ORAL | Status: DC

## 2013-12-14 MED ORDER — PNEUMOCOCCAL VAC POLYVALENT 25 MCG/0.5ML IJ INJ: 0.5000 mL | INJECTION | INTRAMUSCULAR | Status: DC

## 2013-12-14 MED ORDER — OXYCODONE-ACETAMINOPHEN 5-325 MG PO TABS: 1.0000 | ORAL_TABLET | ORAL | Status: DC | PRN

## 2013-12-14 MED ORDER — IBUPROFEN 600 MG PO TABS: 600.0000 mg | ORAL_TABLET | Freq: Three times a day (TID) | ORAL | Status: DC

## 2013-12-14 NOTE — Progress Notes (Signed)
Subjective:  Up without problems.  Objective:  Vital Signs in the last 24 hours: Temp:  [97.6 F (36.4 C)-98.6 F (37 C)] 98.4 F (36.9 C) (02/20 0739) Pulse Rate:  [75-95] 75 (02/20 0739) Resp:  [14-25] 25 (02/20 0739) BP: (121-134)/(64-77) 134/71 mmHg (02/20 0739) SpO2:  [91 %-99 %] 91 % (02/20 0739) Weight:  [236 lb 5.3 oz (107.2 kg)] 236 lb 5.3 oz (107.2 kg) (02/20 0500)  Intake/Output from previous day:  Intake/Output Summary (Last 24 hours) at 12/14/13 0934 Last data filed at 12/14/13 0800  Gross per 24 hour  Intake   2400 ml  Output   1350 ml  Net   1050 ml    Physical Exam: General appearance: alert, cooperative and no distress Lungs: few rhonchi Heart: regular rate and rhythm   Rate: 75  Rhythm: normal sinus rhythm  Lab Results:  Recent Labs  12/13/13 0234 12/14/13 0257  WBC 9.6 8.7  HGB 11.7* 12.4*  PLT 401* 400    Recent Labs  12/13/13 0234 12/14/13 0257  NA 138 140  K 3.3* 3.6*  CL 100 103  CO2 25 25  GLUCOSE 93 87  BUN 8 8  CREATININE 0.66 0.62   No results found for this basename: TROPONINI, CK, MB,  in the last 72 hours No results found for this basename: INR,  in the last 72 hours  Imaging: Imaging results have been reviewed  Cardiac Studies:  Assessment/Plan:   Principal Problem:   Pericardial effusion- S/P pericardial window 12/11/13 Active Problems:   Acute pericarditis, unspecified   Fever, unspecified   CAP (community acquired pneumonia)   Leukocytosis, unspecified   Hypokalemia    PLAN: Would continue NSAID with gastric protection and colchicine for 2 weeks and then stop. F/U limited echo in 2 weeks office visit, (this has been scheduled). Can use sed rate to monitor resolution of inflammatory process. OK to discharge from our standpoint.    Corine Shelter PA-C Beeper [phone] 12/14/2013, 9:34 AM   I have seen and examined the patient along with Corine Shelter PA-C.  I have reviewed the chart, notes and new data.  I  agree with PA's note.  PLAN: DC home with f/u as described above  Thurmon Fair, MD, [facility] [phone] 12/14/2013, 9:46 AM

## 2013-12-14 NOTE — Telephone Encounter (Signed)
Returned call.  Left message to call back Monday before 4pm or call back and page on-call provider if needed.

## 2013-12-14 NOTE — Progress Notes (Signed)
3 Days Post-Op Procedure(s) (LRB): PERICARDIAL WINDOW (N/A) INTRAOPERATIVE TRANSESOPHAGEAL ECHOCARDIOGRAM (N/A) Subjective: Some incisional pain  Objective: Vital signs in last 24 hours: Temp:  [97.6 F (36.4 Harmon)-98.6 F (37 Harmon)] 98.4 F (36.9 Harmon) (02/20 0739) Pulse Rate:  [75-95] 75 (02/20 0739) Cardiac Rhythm:  [-] Normal sinus rhythm (02/20 0739) Resp:  [14-25] 25 (02/20 0739) BP: (121-134)/(64-77) 134/71 mmHg (02/20 0739) SpO2:  [91 %-99 %] 91 % (02/20 0739) Weight:  [236 lb 5.3 oz (107.2 kg)] 236 lb 5.3 oz (107.2 kg) (02/20 0500)  Hemodynamic parameters for last 24 hours:    Intake/Output from previous day: 02/19 0701 - 02/20 0700 In: 2450 [P.O.:1800; I.V.:50; IV Piggyback:600] Out: 1950 [Urine:1950] Intake/Output this shift: Total I/O In: 240 [P.O.:240] Out: -   General appearance: alert, cooperative and no distress Heart: regular rate and rhythm Wound: clean and dry  Lab Results:  Recent Labs  12/13/13 0234 12/14/13 0257  WBC 9.6 8.7  HGB 11.7* 12.4*  HCT 34.7* 35.9*  PLT 401* 400   BMET:  Recent Labs  12/13/13 0234 12/14/13 0257  NA 138 140  K 3.3* 3.6*  CL 100 103  CO2 25 25  GLUCOSE 93 87  BUN 8 8  CREATININE 0.66 0.62  CALCIUM 8.0* 8.3*    PT/INR: No results found for this basename: LABPROT, INR,  in the last 72 hours ABG    Component Value Date/Time   PHART 7.446 12/12/2013 0410   HCO3 27.2* 12/12/2013 0410   TCO2 28.5 12/12/2013 0410   O2SAT 96.6 12/12/2013 0410   CBG (last 3)  No results found for this basename: GLUCAP,  in the last 72 hours  Assessment/Plan: S/P Procedure(s) (LRB): PERICARDIAL WINDOW (N/A) INTRAOPERATIVE TRANSESOPHAGEAL ECHOCARDIOGRAM (N/A) acute pericarditis s/p pericardial window Tube out Incision healing well On ibuprofen and colchicine OK to dc from our standpoint whenever medical issues allow   LOS: 4 days    Andrew Harmon 12/14/2013

## 2013-12-14 NOTE — Telephone Encounter (Signed)
While he was in the hospital his back start itching bad and now he has a rash. Wants to know if he can take Bendryl with his other medicine and his condition.

## 2013-12-14 NOTE — Discharge Summary (Signed)
DISCHARGE SUMMARY  Carrie MewDavid Kmetz  MR#: [id number]  DOB:1986/01/08  Date of Admission: 12/10/2013 Date of Discharge: 12/14/2013  Attending Physician:Chastity Noland T  Patient's VOZ:[id number]:Woodruff, Humberto LeepJohn H, MD  Consults: Cardiology  CT surgery  Disposition: D/C home  Follow-up Appts:     Follow-up Information   Follow up with Thurmon FairROITORU,MIHAI, MD On 01/04/2014. (8:45)    Specialty:  Cardiology   Contact information:   [address] [place] [address] [phone]       Follow up with [facility] On 12/31/2013. (8 am)    Contact information:   [address] [place] [id number] [phone]     F/U Issues: - repeat TTE will be assessed in 2 week f/u  - Histoplasma antigen pending at time of d/c  - recheck of K+ level is suggested when pt seen in the office   Discharge Diagnoses: Pericardial effusion - acute pericarditis  CAP w/ Sepsis   Hypokalemia  Asthma w/o acute exacerbation   Initial presentation: 28 y.o. male admitted on 12/10/2013 with a history of asthma who presented with fever, cough, and trouble breathing. Patient stated that he was in his usual state of health until 12/02/13 when he developed onset of dyspnea which was worse on lying down. He was seen at an Urgent Care where flu testing was negative. He developed a fever of 104F. He was treated for flu and a bacterial infection with a course of Tamiflu and Z-Pak. His dyspnea initially seemed to get better but dyspnea on exertion persisted. He developed chest pain which was worse with deep inspiration and continued to have low-grade fever 99-101F. Due to persisting symptoms, the patient went to the ED where CT scan revealed a moderate to large pericardial effusion and a patchy opacity anterior inferior left upper lobe compatible with bronchopneumonia. [facility]   Hospital Course:  Pericardial effusion - acute pericarditis  - s/p window with 500 ml of fluid  removed - ongoing care was provided by [facility] - chest tube discontinued on 2/19 - inflammatory fluid c/w nonspecific inflammatory pattern - continue NSAID with gastric protection and colchicine for 2 weeks  - F/U limited echo in 2 weeks office visit per Cardiology   CAP w/ Sepsis  - vanc and cefepime were dosed for empiric coverage for ~5 day course  - check for histoplasma [place] to [place] pending at time of d/c  - legionella and strep screens negative  - clinically improving, w/ resolution of sepsis physiology   Hypokalemia  - replaced - Mg ordered but was canceled w/ no explanation     Medication List    STOP taking these medications       acetaminophen 325 MG tablet  Commonly known as:  TYLENOL      TAKE these medications       colchicine 0.6 MG tablet  Take 1 tablet (0.6 mg total) by mouth 2 (two) times daily.     ibuprofen 600 MG tablet  Commonly known as:  ADVIL,MOTRIN  Take 1 tablet (600 mg total) by mouth 3 (three) times daily.     oxyCODONE-acetaminophen 5-325 MG per tablet  Commonly known as:  PERCOCET/ROXICET  Take 1-2 tablets by mouth every 4 (four) hours as needed for severe pain.     pantoprazole 40 MG tablet  Commonly known as:  PROTONIX  Take 1 tablet (40 mg total) by mouth daily at 6 (six) AM.       Day of  Discharge BP 134/71  Pulse 75  Temp(Src) 98.4 F (36.9 C) (Oral)  Resp 25  Ht 5\' 10"  (1.778 m)  Wt 107.2 kg (236 lb 5.3 oz)  BMI 33.91 kg/m2  SpO2 91%  Physical Exam: General: No acute respiratory distress  Lungs: Clear to auscultation bilaterally without wheezes or crackles Cardiovascular: Regular rate and rhythm without murmur gallop or rub Abdomen: Nontender, nondistended, soft, bowel sounds positive, no rebound, no ascites, no appreciable mass Extremities: No significant cyanosis, clubbing, or edema bilateral lower extremities  Results for orders placed during the hospital encounter of 12/10/13 (from the past 24 hour(s))    BASIC METABOLIC PANEL     Status: Abnormal   Collection Time    12/14/13  2:57 AM      Result Value Ref Range   Sodium 140  137 - 147 mEq/L   Potassium 3.6 (*) 3.7 - 5.3 mEq/L   Chloride 103  96 - 112 mEq/L   CO2 25  19 - 32 mEq/L   Glucose, Bld 87  70 - 99 mg/dL   BUN 8  6 - 23 mg/dL   Creatinine, Ser 4.26  0.50 - 1.35 mg/dL   Calcium 8.3 (*) 8.4 - 10.5 mg/dL   GFR calc non Af Amer >90  >90 mL/min   GFR calc Af Amer >90  >90 mL/min  CBC     Status: Abnormal   Collection Time    12/14/13  2:57 AM      Result Value Ref Range   WBC 8.7  4.0 - 10.5 K/uL   RBC 4.09 (*) 4.22 - 5.81 MIL/uL   Hemoglobin 12.4 (*) 13.0 - 17.0 g/dL   HCT 83.4 (*) 19.6 - 22.2 %   MCV 87.8  78.0 - 100.0 fL   MCH 30.3  26.0 - 34.0 pg   MCHC 34.5  30.0 - 36.0 g/dL   RDW 97.9  89.2 - 11.9 %   Platelets 400  150 - 400 K/uL    Time spent in discharge (includes decision making & examination of pt): >30 minutes  12/14/2013, 11:28 AM   Lonia Blood, MD [facility] Office  [phone] Pager [phone]  On-Call/Text Page:      [email or web address]      password [facility]

## 2013-12-14 NOTE — Discharge Instructions (Signed)
Pericardial Effusion °Pericardial effusion is an accumulation of extra fluid in the pericardial space. This is the space between the heart and the sac that surrounds it. This space normally contains a small amount of fluid which serves as lubrication for the heart inside the sac. If the amount of fluid increases, it causes problems for the working of the heart. This is called a heart (cardiac) tamponade.  °CAUSES  °A higher incidence of pericardial effusion is associated with certain diseases. Some common causes of pericardial effusion are: °· Infections (bacterial, viral, fungal, from AIDS, etc.). °· Cancer which has spread to the pericardial sac. °· Fluid accumulation in the sac after a heart attack or open heart surgery. °· Injury (a fall with chest injury or as a result of a knife or gunshot wound) with bleeding into the pericardial sac. °· Immune diseases (such as Rheumatoid Arthritis) and other arthritis conditions. °· Reactions (uncommon) to medications. °SYMPTOMS  °Very small effusions may cause no problems. Even a small effusion if it comes on rapidly can be deadly. °Some common symptoms are: °· Chest pain, pressure, discomfort. °· Light-headed feeling, fainting. °· Cough, shortness of breath. °· Feeling of palpitations. °· Hiccoughs °· Anxiety or confusion °DIAGNOSIS  °Your caregiver may do a number of blood tests and imaging tests (like X-rays) to help find the cause.  °· ECHO (Echocardiographic stress test) has become the diagnostic method of choice. This is due to its portability and availability. CT and MRI are also used, and may be more accurate. °· Pericardioscopy, where available, uses a telescope-like instrument to look inside the pericardial sac. °· This may be helpful in cases of unexplained pericardial effusions. It allows your caregiver to look at the pericardium and do pericardial biopsies if something abnormal is found. °· Sometimes fluid may be removed to help with diagnosing the cause of  the accumulation. This is called a pericardiocentesis. °TREATMENT  °Treatment is directed at removal of the extra pericardial fluid and treating the cause. Small amounts of effusion that are not causing problems can simply be watched. If the fluid is accumulating rapidly and resulting in cardiac tamponade, this can be life-threatening. Emergency removal of fluid must be done.  °SEEK IMMEDIATE MEDICAL CARE IF:  °· You develop chest pain, irregular heart beat (palpitations) or racing heart, shortness of breath, or begin sweating. °· You become lightheaded or pass out. °· You develop increasing swelling of the legs. °Document Released: 06/08/2005 Document Revised: 01/03/2012 Document Reviewed: 05/24/2008 °[employer]® Patient Information ©2014 [employer]. ° °

## 2013-12-14 NOTE — Progress Notes (Signed)
Pt to d/c home. Wanted vaccines, checked to see if they had been admin, then checked w/ DR to see if appropriate. Ordered vax, then Pt did not want to wait. Told Pt he could get them at DR office during follow up visit.

## 2013-12-15 LAB — HISTOPLASMA ANTIGEN, URINE: Histoplasma Antigen, urine: <0.5 ng/mL

## 2013-12-15 LAB — CULTURE, BLOOD (SINGLE)

## 2013-12-16 LAB — ANAEROBIC CULTURE: GRAM STAIN: NONE SEEN

## 2013-12-17 LAB — CULTURE, BLOOD (ROUTINE X 2)
CULTURE: NO GROWTH
Culture: NO GROWTH

## 2013-12-17 LAB — VIRAL CULTURE VIRC

## 2013-12-17 LAB — MISCELLANEOUS TEST

## 2013-12-19 ENCOUNTER — Ambulatory Visit (INDEPENDENT_AMBULATORY_CARE_PROVIDER_SITE_OTHER): Payer: Self-pay | Admitting: Cardiothoracic Surgery

## 2013-12-19 ENCOUNTER — Encounter: Payer: Self-pay | Admitting: Cardiothoracic Surgery

## 2013-12-19 VITALS — BP 145/84 | HR 82 | Resp 16 | Ht 70.0 in | Wt 235.0 lb

## 2013-12-19 DIAGNOSIS — R21 Rash and other nonspecific skin eruption: Secondary | ICD-10-CM

## 2013-12-19 DIAGNOSIS — I313 Pericardial effusion (noninflammatory): Secondary | ICD-10-CM

## 2013-12-19 DIAGNOSIS — Z09 Encounter for follow-up examination after completed treatment for conditions other than malignant neoplasm: Secondary | ICD-10-CM

## 2013-12-19 DIAGNOSIS — Z0271 Encounter for disability determination: Secondary | ICD-10-CM

## 2013-12-19 DIAGNOSIS — I3139 Other pericardial effusion (noninflammatory): Secondary | ICD-10-CM

## 2013-12-19 DIAGNOSIS — I319 Disease of pericardium, unspecified: Secondary | ICD-10-CM

## 2013-12-19 LAB — PROTEIN, PERICARDIAL FLUID

## 2013-12-19 MED ORDER — OXYCODONE-ACETAMINOPHEN 5-325 MG PO TABS: 1.0000 | ORAL_TABLET | ORAL | Status: DC | PRN

## 2013-12-19 NOTE — Patient Instructions (Signed)
Stop Colchine and protonix Call if rash is not improving stopping meds

## 2013-12-19 NOTE — Progress Notes (Signed)
[address]       [place] [address]             [phone]      Andrew Harmon [facility] Medical Record #[id number] Date of Birth: Nov 03, 1985  Referring: Quincy Carnes, MD Primary Care: Quincy Carnes, MD  Chief Complaint:   POST OP FOLLOW UP 12/11/2013  OPERATIVE REPORT  PREOPERATIVE DIAGNOSIS: Large pericardial effusion.  POSTOPERATIVE DIAGNOSIS: Large pericardial effusion with evidence of  pericarditis.  PROCEDURE PERFORMED: Subxiphoid pericardial window with drainage of  pericardial effusion and TEE.  SURGEON: Sheliah Plane, MD  History of Present Illness:      Patient now is 10 days postop from drainage of a large pericardial effusion and subxiphoid pericardial window. He's been doing well at home without fever chills or night sweats. He does note over the last several days and increasing red itching rash has not responded to topical steroids. Prior to discharge his bacterial and viral cultures were negative from the pericardial fluid. Overall he notes that he feels much better than he did 2 weeks ago.    Past Medical History  Diagnosis Date  . Asthma      History  Smoking status  . Current Every Day Smoker -- 1.00 packs/day  Smokeless tobacco  . Not on file    History  Alcohol Use  . Yes     Allergies  Allergen Reactions  . Amoxicillin Hives  . Penicillins     Per his doctor    Current Outpatient Prescriptions  Medication Sig Dispense Refill  . colchicine 0.6 MG tablet Take 1 tablet (0.6 mg total) by mouth 2 (two) times daily.  28 tablet  0  . ibuprofen (ADVIL,MOTRIN) 600 MG tablet Take 1 tablet (600 mg total) by mouth 3 (three) times daily.  42 tablet  0  . oxyCODONE-acetaminophen (PERCOCET/ROXICET) 5-325 MG per tablet Take 1-2 tablets by mouth every 4 (four) hours as needed for severe pain.  30 tablet  0  . pantoprazole (PROTONIX) 40 MG tablet Take 1 tablet (40 mg total) by mouth daily at 6 (six) AM.  14 tablet  0     No current facility-administered medications for this visit.       Physical Exam: BP 145/84  Pulse 82  Resp 16  Ht 5\' 10"  (1.778 m)  Wt 235 lb (106.595 kg)  BMI 33.72 kg/m2  SpO2 97%  General appearance: alert and cooperative Neurologic: intact Heart: regular rate and rhythm, S1, S2 normal, no murmur, click, rub or gallop Lungs: clear to auscultation bilaterally Abdomen: soft, non-tender; bowel sounds normal; no masses,  no organomegaly Extremities: extremities normal, atraumatic, no cyanosis or edema and Homans sign is negative, no sign of DVT Wound: well healed chest suture removed Rash on back and chest: see photo       Diagnostic Studies & Laboratory data:     Recent Radiology Findings:   No results found.    Recent Lab Findings: Lab Results  Component Value Date   WBC 8.7 12/14/2013   HGB 12.4* 12/14/2013   HCT 35.9* 12/14/2013   PLT 400 12/14/2013   GLUCOSE 87 12/14/2013   ALT 82* 12/13/2013   AST 39* 12/13/2013   NA 140 12/14/2013   K 3.6* 12/14/2013   CL 103 12/14/2013   CREATININE 0.62 12/14/2013   BUN 8 12/14/2013   CO2 25 12/14/2013   TSH 2.736 12/13/2013   INR 1.32 12/10/2013  Assessment / Plan:     Patient returns to the office today follow up visit after drainage of pericardial effusion and pericardial window:To date bacterial viral and fungal cultures have all been negative, patient had pathologic evidence of pericarditis. It has been on 2 new drugs colchicine and Protonix since discharge. Over the past several days she's noted increasing red raised rash on his back and abdomen with itching. He notes that topical cortisone has not helped. It's possible the rash is drug related, we will hold his colchicine and Protonix which are the 2 new drugs that he is never had before. Overall he feels much better than he did prior to drainage of this large pericardial effusion.  He will call back in the next several days if he does not see improvement in the  rash and will be referred to his medical doctor, he has a followup echocardiogram with cardiology March 13.  I plan to see him back when necessary as needed or to request a cardiology.   Delight OvensEdward B Babbette Dalesandro MD      [address] [place] [id number] Office [phone]   Beeper [phone]  12/19/2013 4:15 PM

## 2013-12-31 ENCOUNTER — Ambulatory Visit (HOSPITAL_COMMUNITY): Payer: BC Managed Care – PPO

## 2014-01-02 ENCOUNTER — Ambulatory Visit (HOSPITAL_COMMUNITY)
Admission: RE | Admit: 2014-01-02 | Discharge: 2014-01-02 | Disposition: A | Discharge status: Home / Self Care | Payer: BC Managed Care – PPO | Source: Ambulatory Visit | Attending: Cardiovascular Disease | Admitting: Cardiovascular Disease

## 2014-01-02 DIAGNOSIS — I313 Pericardial effusion (noninflammatory): Secondary | ICD-10-CM

## 2014-01-02 DIAGNOSIS — Z48812 Encounter for surgical aftercare following surgery on the circulatory system: Secondary | ICD-10-CM | POA: Insufficient documentation

## 2014-01-02 DIAGNOSIS — I3139 Other pericardial effusion (noninflammatory): Secondary | ICD-10-CM

## 2014-01-02 DIAGNOSIS — I309 Acute pericarditis, unspecified: Secondary | ICD-10-CM | POA: Insufficient documentation

## 2014-01-02 DIAGNOSIS — I369 Nonrheumatic tricuspid valve disorder, unspecified: Secondary | ICD-10-CM

## 2014-01-02 NOTE — Progress Notes (Signed)
2D Echo Performed 01/02/2014    Tylan Briguglio, RCS  

## 2014-01-04 ENCOUNTER — Encounter: Payer: Self-pay | Admitting: Cardiovascular Disease

## 2014-01-04 ENCOUNTER — Ambulatory Visit (INDEPENDENT_AMBULATORY_CARE_PROVIDER_SITE_OTHER): Payer: BC Managed Care – PPO | Admitting: Cardiovascular Disease

## 2014-01-04 VITALS — BP 106/72 | HR 82 | Resp 16 | Ht 70.0 in | Wt 227.8 lb

## 2014-01-04 DIAGNOSIS — R0602 Shortness of breath: Secondary | ICD-10-CM

## 2014-01-04 DIAGNOSIS — I3139 Other pericardial effusion (noninflammatory): Secondary | ICD-10-CM

## 2014-01-04 DIAGNOSIS — I313 Pericardial effusion (noninflammatory): Secondary | ICD-10-CM

## 2014-01-04 DIAGNOSIS — I428 Other cardiomyopathies: Secondary | ICD-10-CM

## 2014-01-04 DIAGNOSIS — I429 Cardiomyopathy, unspecified: Secondary | ICD-10-CM

## 2014-01-04 DIAGNOSIS — I319 Disease of pericardium, unspecified: Secondary | ICD-10-CM

## 2014-01-04 NOTE — Patient Instructions (Signed)
Your physician has requested that you have an echocardiogram in 3 months. Echocardiography is a painless test that uses sound waves to create images of your heart. It provides your doctor with information about the size and shape of your heart and how well your heart's chambers and valves are working. This procedure takes approximately one hour. There are no restrictions for this procedure.  Dr Royann Shivers wants you to follow-up in 1 year. You will receive a reminder letter in the mail two months in advance. If you don't receive a letter, please call our office to schedule the follow-up appointment.

## 2014-01-04 NOTE — Progress Notes (Signed)
Patient ID: Andrew Harmon, male   DOB: Sep 17, 1986, 28 y.o.   MRN: [id number]      Reason for office visit Followup acute pericarditis  Andrew Harmon is now almost one month status post surgical drainage of a large pericardial effusion with near tamponade features. He is feeling much better after the fluid was evacuated. Colchicine and Protonix were stopped since developed a paretic rash all over his back. The rash seems to be getting slowly better although he still has some scaly erythematous plaques all over his back. He is using hydrocortisone cream. He surgical site has healed well. None of the microbiological assays have been positive. 3 week reports on his AFB and fungal cultures are negative, but are not yet final. His electrocardiogram shows isolated inverted T waves in leads 1 and aVL but is otherwise normal. A followup echo performed a few days ago showed no evidence of any residual pericardial effusion. His left ventricular systolic function, interestingly was slightly depressed at 40-45% per the report and regional wall motion abnormalities were described. I have looked at the images myself and believe that there is very mild diffuse hypokinesis, I do not see any regional variation. I also believe the ejection fraction was underestimated and that it is closer to 50% rather than the reported 40-45%. Sedimentation rate on presentation was 121 and has not been repeated.   Allergies  Allergen Reactions  . Amoxicillin Hives  . Penicillins     Per his doctor  . Colchicine Rash    Current Outpatient Prescriptions  Medication Sig Dispense Refill  . diphenhydrAMINE (BENADRYL) 25 MG tablet Take 25 mg by mouth every 6 (six) hours as needed.      . hydrocortisone cream 0.5 % Apply 1 application topically 2 (two) times daily.      Marland Kitchen ibuprofen (ADVIL,MOTRIN) 600 MG tablet Take 1 tablet (600 mg total) by mouth 3 (three) times daily.  42 tablet  0  . oxyCODONE-acetaminophen (PERCOCET/ROXICET) 5-325 MG  per tablet Take 1-2 tablets by mouth every 4 (four) hours as needed for severe pain.  30 tablet  0   No current facility-administered medications for this visit.    Past Medical History  Diagnosis Date  . Asthma     Past Surgical History  Procedure Laterality Date  . No past surgeries    . Pericardial window N/A 12/11/2013    Procedure: PERICARDIAL WINDOW;  Surgeon: Andrew Ovens, MD;  Location: [facility] OR;  Service: Thoracic;  Laterality: N/A;  W/ TEE  . Intraoperative transesophageal echocardiogram N/A 12/11/2013    Procedure: INTRAOPERATIVE TRANSESOPHAGEAL ECHOCARDIOGRAM;  Surgeon: Andrew Ovens, MD;  Location: [facility] OR;  Service: Open Heart Surgery;  Laterality: N/A;    No family history on file.  History   Social History  . Marital Status: Unknown    Spouse Name: N/A    Number of Children: N/A  . Years of Education: N/A   Occupational History  . Not on file.   Social History Main Topics  . Smoking status: Current Every Day Smoker -- 1.00 packs/day  . Smokeless tobacco: Not on file  . Alcohol Use: Yes  . Drug Use: No  . Sexual Activity: Yes   Other Topics Concern  . Not on file   Social History Narrative  . No narrative on file    Review of systems: The patient specifically denies any chest pain at rest or with exertion, dyspnea at rest or with exertion, orthopnea, paroxysmal nocturnal dyspnea, syncope, palpitations, focal  neurological deficits, intermittent claudication, lower extremity edema, unexplained weight gain, cough, hemoptysis or wheezing.  The patient also denies abdominal pain, nausea, vomiting, dysphagia, diarrhea, constipation, polyuria, polydipsia, dysuria, hematuria, frequency, urgency, abnormal bleeding or bruising, fever, chills, unexpected weight changes, mood swings, change in skin or hair texture, change in voice quality, auditory or visual problems,  new musculoskeletal complaints other than usual "aches and pains".   PHYSICAL EXAM BP  106/72  Pulse 82  Resp 16  Ht 5\' 10"  (1.778 m)  Wt 103.329 kg (227 lb 12.8 oz)  BMI 32.69 kg/m2  General: Alert, oriented x3, no distress Head: no evidence of trauma, PERRL, EOMI, no exophtalmos or lid lag, no myxedema, no xanthelasma; normal ears, nose and oropharynx Neck: normal jugular venous pulsations and no hepatojugular reflux; brisk carotid pulses without delay and no carotid bruits Chest: clear to auscultation, no signs of consolidation by percussion or palpation, normal fremitus, symmetrical and full respiratory excursions Cardiovascular: normal position and quality of the apical impulse, regular rhythm, normal first and second heart sounds, no murmurs, rubs or gallops Abdomen: no tenderness or distention, no masses by palpation, no abnormal pulsatility or arterial bruits, normal bowel sounds, no hepatosplenomegaly Extremities: no clubbing, cyanosis or edema; 2+ radial, ulnar and brachial pulses bilaterally; 2+ right femoral, posterior tibial and dorsalis pedis pulses; 2+ left femoral, posterior tibial and dorsalis pedis pulses; no subclavian or femoral bruits Neurological: grossly nonfocal Skin: Slightly erythematous, desquamating rash over his entire back  EKG: Sinus rhythm, inverted T waves in leads 1 and aVL, otherwise normal  Lipid Panel  No results found for this basename: chol, trig, hdl, cholhdl, vldl, ldlcalc    BMET    Component Value Date/Time   NA 140 12/14/2013 0257   K 3.6* 12/14/2013 0257   CL 103 12/14/2013 0257   CO2 25 12/14/2013 0257   GLUCOSE 87 12/14/2013 0257   BUN 8 12/14/2013 0257   CREATININE 0.62 12/14/2013 0257   CALCIUM 8.3* 12/14/2013 0257   GFRNONAA >90 12/14/2013 0257   GFRAA >90 12/14/2013 0257     ASSESSMENT AND PLAN Pericardial effusion- S/P pericardial window 12/11/13 Statistically, it is most likely that he had acute viral pericarditis, although this diagnosis cannot be confidently made. It looks like his fungal and AFB cultures will be  negative. Colchicine had to be stopped secondary to an allergic reaction. We reviewed the fact that his pericarditis might be the initial signal in the more complicated, systemic disorders such as an autoimmune disease or idiopathic relapsing pericarditis. Only time will tell. It is possible that the mildly depressed left ventricular systolic function is part of acute myopericarditis. He has no clinical signs of heart failure whatsoever and as described above I really believe the ejection fraction is only mildly depressed (note that his fractional shortening was 29%, lower limit of normal). I think it would be wise to repeat an echocardiogram in 2-3 months to make sure that things are completely back to normal and that this is not a more complicated cardiomyopathy. I don't think pharmacological therapy is justified.   Orders Placed This Encounter  Procedures  . EKG 12-Lead  . 2D Echocardiogram without contrast   Meds ordered this encounter  Medications  . diphenhydrAMINE (BENADRYL) 25 MG tablet    Sig: Take 25 mg by mouth every 6 (six) hours as needed.  . hydrocortisone cream 0.5 %    Sig: Apply 1 application topically 2 (two) times daily.    Junious SilkROITORU,Laurita Peron  Teagan Ozawa, MD,  [facility] [phone] office [phone] pager

## 2014-01-04 NOTE — Assessment & Plan Note (Addendum)
Statistically, it is most likely that he had acute viral pericarditis, although this diagnosis cannot be confidently made. It looks like his fungal and AFB cultures will be negative. Colchicine had to be stopped secondary to an allergic reaction. We reviewed the fact that his pericarditis might be the initial signal in the more complicated, systemic disorders such as an autoimmune disease or idiopathic relapsing pericarditis. Only time will tell. It is possible that the mildly depressed left ventricular systolic function is part of acute myopericarditis. He has no clinical signs of heart failure whatsoever and as described above I really believe the ejection fraction is only mildly depressed (note that his fractional shortening was 29%, lower limit of normal). I think it would be wise to repeat an echocardiogram in 2-3 months to make sure that things are completely back to normal and that this is not a more complicated cardiomyopathy. I don't think pharmacological therapy is justified.

## 2014-01-08 LAB — FUNGUS CULTURE W SMEAR: Fungal Smear: NONE SEEN

## 2014-01-23 LAB — AFB CULTURE WITH SMEAR (NOT AT ARMC): Acid Fast Smear: NONE SEEN

## 2014-04-11 ENCOUNTER — Ambulatory Visit (HOSPITAL_COMMUNITY): Payer: BC Managed Care – PPO

## 2014-04-15 ENCOUNTER — Ambulatory Visit (HOSPITAL_COMMUNITY)
Admission: RE | Admit: 2014-04-15 | Discharge: 2014-04-15 | Disposition: A | Discharge status: Home / Self Care | Payer: BC Managed Care – PPO | Source: Ambulatory Visit | Attending: Cardiovascular Disease | Admitting: Cardiovascular Disease

## 2014-04-15 DIAGNOSIS — I059 Rheumatic mitral valve disease, unspecified: Secondary | ICD-10-CM

## 2014-04-15 DIAGNOSIS — I428 Other cardiomyopathies: Secondary | ICD-10-CM | POA: Insufficient documentation

## 2014-04-15 DIAGNOSIS — I429 Cardiomyopathy, unspecified: Secondary | ICD-10-CM

## 2014-04-15 NOTE — Progress Notes (Signed)
2D Echo Performed 04/15/2014    Tammie Crouch, RCS  

## 2014-12-10 ENCOUNTER — Telehealth (HOSPITAL_COMMUNITY): Payer: Self-pay | Admitting: *Deleted

## 2014-12-10 DIAGNOSIS — I3139 Other pericardial effusion (noninflammatory): Secondary | ICD-10-CM

## 2014-12-10 DIAGNOSIS — I313 Pericardial effusion (noninflammatory): Secondary | ICD-10-CM

## 2014-12-10 NOTE — Telephone Encounter (Signed)
Pt states that Dr.C wants him to have a final echo in March. I dont see an order or a recall  Please advise

## 2014-12-10 NOTE — Telephone Encounter (Signed)
Do you want Andrew Harmon to have an Echo in March.  He is in recall for June 2016 for an office visit.

## 2014-12-10 NOTE — Telephone Encounter (Signed)
Yes please, anytime between March and f/u visit. Limited for pericardial effusion.

## 2014-12-13 ENCOUNTER — Telehealth (HOSPITAL_COMMUNITY): Payer: Self-pay | Admitting: *Deleted

## 2015-01-06 ENCOUNTER — Ambulatory Visit (HOSPITAL_COMMUNITY)
Admission: RE | Admit: 2015-01-06 | Discharge: 2015-01-06 | Disposition: A | Discharge status: Home / Self Care | Payer: BLUE CROSS/BLUE SHIELD | Source: Ambulatory Visit | Attending: Cardiovascular Disease | Admitting: Cardiovascular Disease

## 2015-01-06 DIAGNOSIS — I429 Cardiomyopathy, unspecified: Secondary | ICD-10-CM | POA: Insufficient documentation

## 2015-01-06 DIAGNOSIS — I319 Disease of pericardium, unspecified: Secondary | ICD-10-CM

## 2015-01-06 DIAGNOSIS — I340 Nonrheumatic mitral (valve) insufficiency: Secondary | ICD-10-CM | POA: Diagnosis not present

## 2015-01-06 DIAGNOSIS — I313 Pericardial effusion (noninflammatory): Secondary | ICD-10-CM

## 2015-01-06 DIAGNOSIS — I3139 Other pericardial effusion (noninflammatory): Secondary | ICD-10-CM

## 2015-01-06 NOTE — Progress Notes (Signed)
Limited Study performed.  Bessy Reaney, RCS 

## 2015-01-09 ENCOUNTER — Encounter: Payer: Self-pay | Admitting: Cardiovascular Disease

## 2015-01-09 ENCOUNTER — Ambulatory Visit (INDEPENDENT_AMBULATORY_CARE_PROVIDER_SITE_OTHER): Payer: BLUE CROSS/BLUE SHIELD | Admitting: Cardiovascular Disease

## 2015-01-09 VITALS — BP 126/88 | HR 72 | Resp 16 | Ht 70.0 in | Wt 274.2 lb

## 2015-01-09 DIAGNOSIS — I514 Myocarditis, unspecified: Secondary | ICD-10-CM

## 2015-01-09 DIAGNOSIS — R931 Abnormal findings on diagnostic imaging of heart and coronary circulation: Secondary | ICD-10-CM | POA: Diagnosis not present

## 2015-01-09 LAB — COMPREHENSIVE METABOLIC PANEL
ALT: 47 U/L (ref 0–53)
AST: 26 U/L (ref 0–37)
Albumin: 4.3 g/dL (ref 3.5–5.2)
Alkaline Phosphatase: 92 U/L (ref 39–117)
BUN: 9 mg/dL (ref 6–23)
CO2: 19 meq/L (ref 19–32)
CREATININE: 0.8 mg/dL (ref 0.50–1.35)
Calcium: 9.7 mg/dL (ref 8.4–10.5)
Chloride: 106 mEq/L (ref 96–112)
GLUCOSE: 87 mg/dL (ref 70–99)
Potassium: 4.4 mEq/L (ref 3.5–5.3)
Sodium: 139 mEq/L (ref 135–145)
Total Bilirubin: 0.3 mg/dL (ref 0.2–1.2)
Total Protein: 7.3 g/dL (ref 6.0–8.3)

## 2015-01-09 LAB — C-REACTIVE PROTEIN: CRP: <0.5 mg/dL (ref <0.60)

## 2015-01-09 MED ORDER — LISINOPRIL 10 MG PO TABS: 10.0000 mg | ORAL_TABLET | Freq: Every day | ORAL | Status: DC

## 2015-01-09 NOTE — Patient Instructions (Signed)
Your physician recommends that you schedule a follow-up appointment in: 2-3 weeks with PA and 4-6 weeks with Dr Royann Shivers  Your physician recommends that you return for lab work CMP, SED RATE AND CRP  Your physician has recommended you make the following change in your medication: Start Lisinopril 10 mg daily

## 2015-01-09 NOTE — Progress Notes (Signed)
Patient ID: Andrew Harmon, male   DOB: 1986-09-11, 29 y.o.   MRN: [id number]      Cardiology Office Note   Date:  01/10/2015   ID:  Andrew Harmon, DOB 06-20-1986, MRN [id number]  PCP:  Doreatha Massed, MD  Cardiologist:   Sanda Klein, MD   Chief Complaint  Patient presents with  . Follow-up    ECHO results:  No complaints of chest pain, SOB edema .  Occas. lightheadedness.      History of Present Illness: Andrew Harmon is a 29 y.o. male who presents for follow-up after his echocardiogram showed deteriorating left ventricular systolic function. Roughly one year ago he presented with pericardial tamponade on due to a large pericardial effusion that was presumed to be secondary to viral pericarditis. Following a pericardial window his effusion has not recurred. He had a borderline decreased left ventricular systolic function with ejection fraction around 45% at that time, felt to be likely due to a component of viral myocarditis. Inflammatory markers were severely elevated at the time.   A follow-up echocardiogram performed one year later shows no improvement in left ventricular systolic function, maybe even mild worsening so that now left ventricular ejection fraction is 35/40%. There is no evidence of recurrent pericardial effusion or any meaningful valvular abnormalities. The tissue Doppler diastolic velocities are consistent with diastolic dysfunction, but did not show clear evidence of elevated filling pressure (E/e' = 9).  Andrew Harmon has dyspnea on exertion after climbing 1 flight of stairs. He is also severely obese with a BMI that is almost 40. He generally stops after each flight of stairs. He has not had orthopnea, PND, lower showed edema, palpitations, syncope or chest discomfort, either pleuritic or anginal.    Past Medical History  Diagnosis Date  . Asthma     Past Surgical History  Procedure Laterality Date  . No past surgeries    . Pericardial window N/A  12/11/2013    Procedure: PERICARDIAL WINDOW;  Surgeon: Grace Isaac, MD;  Location: [facility] OR;  Service: Thoracic;  Laterality: N/A;  W/ TEE  . Intraoperative transesophageal echocardiogram N/A 12/11/2013    Procedure: INTRAOPERATIVE TRANSESOPHAGEAL ECHOCARDIOGRAM;  Surgeon: Grace Isaac, MD;  Location: [facility];  Service: Open Heart Surgery;  Laterality: N/A;     Current Outpatient Prescriptions  Medication Sig Dispense Refill  . ibuprofen (ADVIL,MOTRIN) 600 MG tablet Take 1 tablet (600 mg total) by mouth 3 (three) times daily. (Patient taking differently: Take 600 mg by mouth every 8 (eight) hours as needed. ) 42 tablet 0  . lisinopril (PRINIVIL,ZESTRIL) 10 MG tablet Take 1 tablet (10 mg total) by mouth daily. 30 tablet 6   No current facility-administered medications for this visit.    Allergies:   Amoxicillin; Penicillins; and Colchicine    Social History:  The patient  reports that he has been smoking.  He does not have any smokeless tobacco history on file. He reports that he drinks alcohol. He reports that he does not use illicit drugs.   Family History:  The patient's family history is in the thickened for the absence of heart disease, either premature or at advanced ages.   ROS:  Please see the history of present illness.     The patient specifically denies any chest pain at rest or with exertion, dyspnea at rest or with exertion, orthopnea, paroxysmal nocturnal dyspnea, syncope, palpitations, focal neurological deficits, intermittent claudication, lower extremity edema, unexplained weight gain, cough, hemoptysis or wheezing.  The  patient also denies abdominal pain, nausea, vomiting, dysphagia, diarrhea, constipation, polyuria, polydipsia, dysuria, hematuria, frequency, urgency, abnormal bleeding or bruising, fever, chills, unexpected weight changes, mood swings, change in skin or hair texture, change in voice quality, auditory or visual problems, allergic reactions or rashes,  new musculoskeletal complaints other than usual "aches and pains".  Otherwise, review of systems positive for none.   All other systems are reviewed and negative.    PHYSICAL EXAM: VS:  BP 126/88 mmHg  Pulse 72  Resp 16  Ht _0  (1.778 m)  Wt 274 lb 3.2 oz (124.376 kg)  BMI 39.34 kg/m2 , BMI Body mass index is 39.34 kg/(m^2).  General: Alert, oriented x3, no distress Head: no evidence of trauma, PERRL, EOMI, no exophtalmos or lid lag, no myxedema, no xanthelasma; normal ears, nose and oropharynx Neck: normal jugular venous pulsations and no hepatojugular reflux; brisk carotid pulses without delay and no carotid bruits Chest: clear to auscultation, no signs of consolidation by percussion or palpation, normal fremitus, symmetrical and full respiratory excursions Cardiovascular: normal position and quality of the apical impulse, regular rhythm, normal first and second heart sounds, no murmurs, rubs or gallops Abdomen: no tenderness or distention, no masses by palpation, no abnormal pulsatility or arterial bruits, normal bowel sounds, no hepatosplenomegaly Extremities: no clubbing, cyanosis or edema; 2+ radial, ulnar and brachial pulses bilaterally; 2+ right femoral, posterior tibial and dorsalis pedis pulses; 2+ left femoral, posterior tibial and dorsalis pedis pulses; no subclavian or femoral bruits Neurological: grossly nonfocal Psych: euthymic mood, full affect   EKG:  EKG is ordered today. The ekg ordered today demonstrates NSR, normal tracing   Recent Labs: 01/09/2015: ALT 47; BUN 9; Creatinine 0.80; Potassium 4.4; Sodium 139    Lipid Panel No results found for: CHOL, TRIG, HDL, CHOLHDL, VLDL, LDLCALC, LDLDIRECT    Wt Readings from Last 3 Encounters:  01/09/15 274 lb 3.2 oz (124.376 kg)  01/04/14 227 lb 12.8 oz (103.329 kg)  12/19/13 235 lb (106.595 kg)      Other studies Reviewed: Additional studies/ records that were reviewed today include: Lab tests obtained after  his visit. Review of the above records demonstrates: Erythrocyte sedimentation rate was normal, CRP was undetectable, normal routine chemistries and liver function tests   ASSESSMENT AND PLAN:  Moderately severe cardiomyopathy of uncertain etiology with NYHA functional class II exertional dyspnea. Onset of cardiomyopathy coincided with the presence of a large pericardial effusion, suggestive of an inflammatory cause. Tissue samples at the time of his pericarditis were nondiagnostic. He does not have angina pectoris, is a very young and his left ventricular dysfunction is global, not suggestive of coronary disease.   Consider cardiac MRI. Current labs do not suggest ongoing inflammatory disorder.  Today will start his ACE inhibitor. When he returns for first follow-up we'll plan to add carvedilol 3.125 mg twice a day and then increased his agents in alternating fashion to the maximum dose is tolerated. Follow-up echocardiogram 3 months after we have successfully titrated these medications.   Current medicines are reviewed at length with the patient today.  The patient does not have concerns regarding medicines.  The following changes have been made:  Add lisinopril 10 mg once daily  Labs/ tests ordered today include:  Orders Placed This Encounter  Procedures  . Comp Met (CMET)  . Sed Rate (ESR)  . C-reactive protein  . EKG 12-Lead   Patient Instructions  Your physician recommends that you schedule a follow-up appointment in: 2-3 weeks with  PA and 4-6 weeks with Dr Sallyanne Kuster  Your physician recommends that you return for lab work [place], [place]  Your physician has recommended you make the following change in your medication: Start Lisinopril 10 mg daily       Signed, Sanda Klein, MD  01/10/2015 5:24 PM    Sanda Klein, MD, [facility] [phone] office [phone] pager

## 2015-01-10 LAB — SEDIMENTATION RATE: SED RATE: 4 mm/h (ref 0–15)

## 2015-01-13 ENCOUNTER — Telehealth (HOSPITAL_COMMUNITY): Payer: Self-pay | Admitting: Cardiovascular Disease

## 2015-01-13 NOTE — Telephone Encounter (Signed)
1.21.16 Received [employer] FMLA form via fax.  Sent to [facility] @ [place] for processing.

## 2015-01-21 ENCOUNTER — Ambulatory Visit (INDEPENDENT_AMBULATORY_CARE_PROVIDER_SITE_OTHER): Payer: BLUE CROSS/BLUE SHIELD | Admitting: Cardiology

## 2015-01-21 ENCOUNTER — Encounter: Payer: Self-pay | Admitting: Cardiology

## 2015-01-21 VITALS — BP 132/90 | HR 78 | Ht 70.0 in | Wt 273.8 lb

## 2015-01-21 DIAGNOSIS — I420 Dilated cardiomyopathy: Secondary | ICD-10-CM | POA: Diagnosis not present

## 2015-01-21 DIAGNOSIS — I3139 Other pericardial effusion (noninflammatory): Secondary | ICD-10-CM

## 2015-01-21 DIAGNOSIS — I319 Disease of pericardium, unspecified: Secondary | ICD-10-CM | POA: Diagnosis not present

## 2015-01-21 DIAGNOSIS — I313 Pericardial effusion (noninflammatory): Secondary | ICD-10-CM

## 2015-01-21 MED ORDER — CARVEDILOL 3.125 MG PO TABS: 3.1250 mg | ORAL_TABLET | Freq: Two times a day (BID) | ORAL | Status: DC

## 2015-01-21 NOTE — Patient Instructions (Addendum)
START Coreg 3.125mg  Twice Daily  Your physician recommends that you schedule a follow-up appointment in: 2 weeks with an extender. Keep scheduled appointment with Dr.Croitoru next month as well.

## 2015-01-21 NOTE — Assessment & Plan Note (Addendum)
EF dropped to 35% on f/u echo March 2016

## 2015-01-21 NOTE — Assessment & Plan Note (Signed)
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

## 2015-01-21 NOTE — Progress Notes (Signed)
    01/21/2015 Vassie Moment   Jun 26, 1986  [id number]  Primary Physician No primary care provider on file. Primary Cardiologist: Dr Royann Shivers  HPI:  29 y/o male, seen in Feb 2015 with pericardial effusion and tamponade. He underwent biopsy and MRI which did not suggest inflammatory disorder. His EF then was 50%. A follow up echo March 2015 showed a drop in his EF to 35%. Dr Royann Shivers added an ACE 2 weeks a go and he is seen in follow up today. He is tolerating lisinopril.    Current Outpatient Prescriptions  Medication Sig Dispense Refill  . ibuprofen (ADVIL,MOTRIN) 600 MG tablet Take 1 tablet (600 mg total) by mouth 3 (three) times daily. (Patient taking differently: Take 600 mg by mouth every 8 (eight) hours as needed. ) 42 tablet 0  . lisinopril (PRINIVIL,ZESTRIL) 10 MG tablet Take 1 tablet (10 mg total) by mouth daily. 30 tablet 6  . carvedilol (COREG) 3.125 MG tablet Take 1 tablet (3.125 mg total) by mouth 2 (two) times daily. 180 tablet 3   No current facility-administered medications for this visit.    Allergies  Allergen Reactions  . Amoxicillin Hives  . Penicillins     Per his doctor  . Colchicine Rash    History   Social History  . Marital Status: Unknown    Spouse Name: N/A  . Number of Children: N/A  . Years of Education: N/A   Occupational History  . Not on file.   Social History Main Topics  . Smoking status: Current Every Day Smoker -- 1.00 packs/day  . Smokeless tobacco: Not on file  . Alcohol Use: Yes  . Drug Use: No  . Sexual Activity: Yes   Other Topics Concern  . Not on file   Social History Narrative     Review of Systems: General: negative for chills, fever, night sweats or weight changes.  Cardiovascular: negative for chest pain, dyspnea on exertion, edema, orthopnea, palpitations, paroxysmal nocturnal dyspnea or shortness of breath Dermatological: negative for rash Respiratory: negative for cough or wheezing Urologic: negative for  hematuria Abdominal: negative for nausea, vomiting, diarrhea, bright red blood per rectum, melena, or hematemesis Neurologic: negative for visual changes, syncope, or dizziness All other systems reviewed and are otherwise negative except as noted above.    Blood pressure 132/90, pulse 78, height 5\' 10"  (1.778 m), weight 273 lb 12.8 oz (124.195 kg).  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 He'll need to be seen in two weeks for follow up. At that time I would increase his lisinopril and possible his Coreg. He sees Dr Royann Shivers in 4 weeks. Repeat echo in late June.   [facility] 01/21/2015 12:38 PM

## 2015-02-06 ENCOUNTER — Encounter: Payer: Self-pay | Admitting: Cardiology

## 2015-02-06 ENCOUNTER — Ambulatory Visit (INDEPENDENT_AMBULATORY_CARE_PROVIDER_SITE_OTHER): Payer: BLUE CROSS/BLUE SHIELD | Admitting: Cardiology

## 2015-02-06 ENCOUNTER — Ambulatory Visit: Payer: BLUE CROSS/BLUE SHIELD | Admitting: Cardiology

## 2015-02-06 VITALS — BP 116/64 | HR 90 | Ht 70.0 in | Wt 274.9 lb

## 2015-02-06 DIAGNOSIS — E878 Other disorders of electrolyte and fluid balance, not elsewhere classified: Secondary | ICD-10-CM | POA: Diagnosis not present

## 2015-02-06 MED ORDER — LISINOPRIL 10 MG PO TABS: 10.0000 mg | ORAL_TABLET | Freq: Two times a day (BID) | ORAL | Status: DC

## 2015-02-06 NOTE — Progress Notes (Signed)
Cardiology Office Note   Date:  02/06/2015   ID:  Andrew Harmon, DOB 01-03-1986, MRN [id number]  PCP:  No primary care provider on file.  Cardiologist:  Dr. Royann Shivers    Chief Complaint  Patient presents with  . Cardiomyopathy    no SOB      History of Present Illness: Andrew Harmon is a 29 y.o. male who presents for titration of meds with new cardiomyopathy  Echo: - Left ventricle: The cavity size was normal. Wall thickness was normal. Systolic function was moderately to severely reduced. The estimated ejection fraction was in the range of 30% to 35%. Diffuse hypokinesis. Features are consistent with a pseudonormal left ventricular filling pattern, with concomitant abnormal relaxation and increased filling pressure (grade 2 diastolic dysfunction). - Mitral valve: There was mild regurgitation. Impressions: - Moderate to severe global reduction in LV function; grade 2 diastolic dysfunction; mild MR; compared to 04/15/14, LV function is worse.  On last visit BB added.  Pt without chest pain or SOB.  Will titrate his ACE today.  Past Medical History  Diagnosis Date  . Asthma     Past Surgical History  Procedure Laterality Date  . No past surgeries    . Pericardial window N/A 12/11/2013    Procedure: PERICARDIAL WINDOW;  Surgeon: Delight Ovens, MD;  Location: [facility] OR;  Service: Thoracic;  Laterality: N/A;  W/ TEE  . Intraoperative transesophageal echocardiogram N/A 12/11/2013    Procedure: INTRAOPERATIVE TRANSESOPHAGEAL ECHOCARDIOGRAM;  Surgeon: Delight Ovens, MD;  Location: [facility] OR;  Service: Open Heart Surgery;  Laterality: N/A;     Current Outpatient Prescriptions  Medication Sig Dispense Refill  . carvedilol (COREG) 3.125 MG tablet Take 1 tablet (3.125 mg total) by mouth 2 (two) times daily. 180 tablet 3  . ibuprofen (ADVIL,MOTRIN) 600 MG tablet Take 1 tablet (600 mg total) by mouth 3 (three) times daily. (Patient taking differently:  Take 600 mg by mouth every 8 (eight) hours as needed. ) 42 tablet 0   No current facility-administered medications for this visit.    Allergies:   Amoxicillin; Penicillins; and Colchicine    Social History:  The patient  reports that he has been smoking.  He does not have any smokeless tobacco history on file. He reports that he drinks alcohol. He reports that he does not use illicit drugs.   Family History:  The patient's family history includes Diabetes in his maternal grandmother and mother; Healthy in his sister; Heart disease in his maternal grandmother.    ROS:  General:no colds or fevers, no weight changes CV:see HPI PUL:see HPI GI:no diarrhea constipation or melena, no indigestion GU:no hematuria, no dysuria MS:no joint pain, no claudication Neuro:no syncope, no lightheadedness   Wt Readings from Last 3 Encounters:  02/06/15 274 lb 14.4 oz (124.694 kg)  01/21/15 273 lb 12.8 oz (124.195 kg)  01/09/15 274 lb 3.2 oz (124.376 kg)     PHYSICAL EXAM: VS:  BP 116/64 mmHg  Pulse 90  Ht 5\' 10"  (1.778 m)  Wt 274 lb 14.4 oz (124.694 kg)  BMI 39.44 kg/m2 , BMI Body mass index is 39.44 kg/(m^2). General:Pleasant affect, NAD Skin:Warm and dry, brisk capillary refill HEENT:normocephalic, sclera clear, mucus membranes moist Neck:supple, no JVD, no bruits  Heart:S1S2 RRR without murmur, gallup, rub or click Lungs:clear without rales, rhonchi, or wheezes OEU:MPNT, non tender, + BS, do not palpate liver spleen or masses Ext:no lower ext edema, 2+ pedal pulses, 2+ radial pulses Neuro:alert and  oriented, MAE, follows commands, + facial symmetry    EKG:  EKG is NOT  ordered today.    Recent Labs: 01/09/2015: ALT 47; BUN 9; Creatinine 0.80; Potassium 4.4; Sodium 139    Lipid Panel No results found for: CHOL, TRIG, HDL, CHOLHDL, VLDL, LDLCALC, LDLDIRECT     Other studies Reviewed: Additional studies/ records that were reviewed today include: previous notes.   ASSESSMENT  AND PLAN:  Congestive dilated cardiomyopathy EF dropped to 35% on f/u echo March 2016 will increase ACE today up prinivil to BId  He follows up with Dr. Judie Petit. Croitoru in 2 weeks.  He will need echo in 3 months after meds are titrated.   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  The patient Has no concerns regarding medicines.  The following changes have been made:  See above Labs/ tests ordered today include:see above  Disposition:   FU:  see above  Signed, Leone Brand, NP  02/06/2015 2:40 PM    [facility] [address] [place], [place], [place]  [address]/ [address] [place], [place] Phone: [phone]; Fax: [phone]  [phone]

## 2015-02-06 NOTE — Patient Instructions (Signed)
You have an appt. To see Dr. Royann Shivers on the 25th of this month  We have increased your lisinopril to 10 mg two times a day  Have you lab done on 4/22

## 2015-02-08 ENCOUNTER — Encounter: Payer: Self-pay | Admitting: Cardiology

## 2015-02-13 LAB — BASIC METABOLIC PANEL
BUN: 9 mg/dL (ref 6–23)
CALCIUM: 9.3 mg/dL (ref 8.4–10.5)
CO2: 26 mEq/L (ref 19–32)
CREATININE: 0.82 mg/dL (ref 0.50–1.35)
Chloride: 102 mEq/L (ref 96–112)
GLUCOSE: 84 mg/dL (ref 70–99)
Potassium: 4.1 mEq/L (ref 3.5–5.3)
SODIUM: 137 meq/L (ref 135–145)

## 2015-02-17 ENCOUNTER — Ambulatory Visit (INDEPENDENT_AMBULATORY_CARE_PROVIDER_SITE_OTHER): Payer: BLUE CROSS/BLUE SHIELD | Admitting: Cardiovascular Disease

## 2015-02-17 ENCOUNTER — Encounter: Payer: Self-pay | Admitting: Cardiovascular Disease

## 2015-02-17 ENCOUNTER — Telehealth: Payer: Self-pay | Admitting: Cardiovascular Disease

## 2015-02-17 VITALS — BP 112/80 | HR 68 | Ht 70.0 in | Wt 278.2 lb

## 2015-02-17 DIAGNOSIS — I313 Pericardial effusion (noninflammatory): Secondary | ICD-10-CM

## 2015-02-17 DIAGNOSIS — I420 Dilated cardiomyopathy: Secondary | ICD-10-CM

## 2015-02-17 DIAGNOSIS — I319 Disease of pericardium, unspecified: Secondary | ICD-10-CM | POA: Diagnosis not present

## 2015-02-17 DIAGNOSIS — I3139 Other pericardial effusion (noninflammatory): Secondary | ICD-10-CM

## 2015-02-17 MED ORDER — CARVEDILOL 6.25 MG PO TABS: 6.2500 mg | ORAL_TABLET | Freq: Two times a day (BID) | ORAL | Status: DC

## 2015-02-17 MED ORDER — LISINOPRIL 20 MG PO TABS: 20.0000 mg | ORAL_TABLET | Freq: Two times a day (BID) | ORAL | Status: DC

## 2015-02-17 NOTE — Telephone Encounter (Signed)
Patient came in office today to drop off FMLA papers, sign release and pay 25.00.  I then put in courier bag to go to the [place] office on 02/17/2015. cbr

## 2015-02-17 NOTE — Progress Notes (Signed)
Patient ID: Andrew Harmon, male   DOB: Apr 28, 1986, 28 y.o.   MRN: [id number]     Cardiology Office Note   Date:  02/18/2015   ID:  Andrew Harmon, DOB 03/31/1986, MRN [id number]  PCP:  No PCP Per Patient  Cardiologist:   Thurmon Fair, MD   Chief Complaint  Patient presents with  . Follow-up    2 week:  No complaints of chest pain, SOB, edema or dizziness.      History of Present Illness: Andrew Harmon is a 29 y.o. male who presents for moderate cardiomyopathy, presumed postviral myopericarditis.  Roughly one year ago he presented with pericardial tamponade on due to a large pericardial effusion that was presumed to be secondary to viral pericarditis. Following a pericardial window his effusion has not recurred. He had a borderline decreased left ventricular systolic function with ejection fraction around 45% at that time, felt to be likely due to a component of viral myocarditis. Inflammatory markers were severely elevated at the time.   A follow-up echocardiogram performed one year later shows no improvement in left ventricular systolic function, maybe even mild worsening so that now left ventricular ejection fraction is 35-40%. There is no evidence of recurrent pericardial effusion or any meaningful valvular abnormalities. The tissue Doppler diastolic velocities are consistent with diastolic dysfunction, but did not show clear evidence of elevated filling pressure (E/e' = 9).  Jebidiah has dyspnea on exertion after climbing 1 flight of stairs. He is also severely obese with a BMI that is almost 40. He generally stops after each flight of stairs. He has not had orthopnea, PND, lower showed edema, palpitations, syncope or chest discomfort, either pleuritic or anginal.  He is tolerating initiation and titration of CHF meds. He went [place] his [place]. Although he had to hike slower than the rest of the party, he did not feel severely  encumbered. NYHA class 1.   Past Medical History  Diagnosis Date  . Asthma     Past Surgical History  Procedure Laterality Date  . Pericardial window N/A 12/11/2013    Procedure: PERICARDIAL WINDOW;  Surgeon: Delight Ovens, MD;  Location: [facility] OR;  Service: Thoracic;  Laterality: N/A;  W/ TEE  . Intraoperative transesophageal echocardiogram N/A 12/11/2013    Procedure: INTRAOPERATIVE TRANSESOPHAGEAL ECHOCARDIOGRAM;  Surgeon: Delight Ovens, MD;  Location[facility] OR;  Service: Open Heart Surgery;  Laterality: N/A;     Current Outpatient Prescriptions  Medication Sig Dispense Refill  . carvedilol (COREG) 6.25 MG tablet Take 1 tablet (6.25 mg total) by mouth 2 (two) times daily. 60 tablet 11  . ibuprofen (ADVIL,MOTRIN) 600 MG tablet Take 1 tablet (600 mg total) by mouth 3 (three) times daily. (Patient taking differently: Take 600 mg by mouth every 8 (eight) hours as needed. ) 42 tablet 0  . lisinopril (PRINIVIL,ZESTRIL) 20 MG tablet Take 1 tablet (20 mg total) by mouth 2 (two) times daily. 60 tablet 11   No current facility-administered medications for this visit.    Allergies:   Amoxicillin; Penicillins; and Colchicine    Social History:  The patient  reports that he has been smoking.  He does not have any smokeless tobacco history on file. He reports that he drinks alcohol. He reports that he does not use illicit drugs.   Family History:  The patient's family history includes Diabetes in his maternal grandmother and mother; Healthy in his sister; Heart disease in his maternal grandmother.  ROS:  Please see the history of present illness.    Otherwise, review of systems positive for none.   All other systems are reviewed and negative.    PHYSICAL EXAM: VS:  BP 112/80 mmHg  Pulse 68  Ht  (1.778 m)  Wt 278 lb 3.2 oz (126.191 kg)  BMI 39.92 kg/m2 , BMI Body mass index is 39.92 kg/(m^2).  General: Alert, oriented x3, no distress Head: no evidence of trauma, PERRL,  EOMI, no exophtalmos or lid lag, no myxedema, no xanthelasma; normal ears, nose and oropharynx Neck: normal jugular venous pulsations and no hepatojugular reflux; brisk carotid pulses without delay and no carotid bruits Chest: clear to auscultation, no signs of consolidation by percussion or palpation, normal fremitus, symmetrical and full respiratory excursions Cardiovascular: normal position and quality of the apical impulse, regular rhythm, normal first and second heart sounds, no murmurs, rubs or gallops Abdomen: no tenderness or distention, no masses by palpation, no abnormal pulsatility or arterial bruits, normal bowel sounds, no hepatosplenomegaly Extremities: no clubbing, cyanosis or edema; 2+ radial, ulnar and brachial pulses bilaterally; 2+ right femoral, posterior tibial and dorsalis pedis pulses; 2+ left femoral, posterior tibial and dorsalis pedis pulses; no subclavian or femoral bruits Neurological: grossly nonfocal Psych: euthymic mood, full affect   EKG:  EKG is not ordered today.   Recent Labs: 01/09/2015: ALT 47 02/12/2015: BUN 9; Creatinine 0.82; Potassium 4.1; Sodium 137    Lipid Panel No results found for: CHOL, TRIG, HDL, CHOLHDL, VLDL, LDLCALC, LDLDIRECT    Wt Readings from Last 3 Encounters:  02/17/15 278 lb 3.2 oz (126.191 kg)  02/06/15 274 lb 14.4 oz (124.694 kg)  01/21/15 273 lb 12.8 oz (124.195 kg)      ASSESSMENT AND PLAN:  Plan to continue CHF med titration.  Increase carvedilol to 6.25 mg BID today. Anticipate slight deterioration in stamina temporarily. Asked him to call if he develops worsening dyspnea or edema. If well tolerated, in two weeks he will also double the ACEi dose, then return for f/u. Continue trying to reach max doses of these meds and then reevaluate LVEF.   Current medicines are reviewed at length with the patient today.  The patient does not have concerns regarding medicines.  The following changes have been made:  INCREASE  Carvedilol to 6.25 mg twice daily - then IN TWO WEEKS INCREASE Lisinopril to 20 mg twice daily  Labs/ tests ordered today include:  No orders of the defined types were placed in this encounter.    Patient Instructions  Dr Royann Shivers has recommended making the following medication changes: INCREASE Carvedilol to 6.25 mg twice daily - OF YOUR CURRENT BOTTLE take 2 tablets twice daily. A new prescription has been sent to your pharmacy for the increased dose, when you pick up the new prescription follow the instructions listed on that bottle. IN TWO WEEKS INCREASE Lisinopril to 20 mg twice daily - OF YOUR CURRENT BOTTLE take 2 tablets twice daily. A new prescription has been sent to your pharmacy for the increased dose, when you pick up the new prescription follow the instructions listed on that bottle.  Dr Royann Shivers recommends that you schedule a follow-up appointment first available.    Joie Bimler, MD  02/18/2015 9:33 PM    Thurmon Fair, MD, [facility] [phone] office [phone] pager

## 2015-02-17 NOTE — Patient Instructions (Addendum)
Dr Royann Shivers has recommended making the following medication changes: INCREASE Carvedilol to 6.25 mg twice daily - OF YOUR CURRENT BOTTLE take 2 tablets twice daily. A new prescription has been sent to your pharmacy for the increased dose, when you pick up the new prescription follow the instructions listed on that bottle. IN TWO WEEKS INCREASE Lisinopril to 20 mg twice daily - OF YOUR CURRENT BOTTLE take 2 tablets twice daily. A new prescription has been sent to your pharmacy for the increased dose, when you pick up the new prescription follow the instructions listed on that bottle.  Dr Royann Shivers recommends that you schedule a follow-up appointment first available.

## 2015-03-17 ENCOUNTER — Encounter: Payer: Self-pay | Admitting: Cardiovascular Disease

## 2015-03-18 ENCOUNTER — Telehealth: Payer: Self-pay | Admitting: Cardiovascular Disease

## 2015-03-18 NOTE — Telephone Encounter (Signed)
Received [employer]A forms from [employer] @ [employer].  Given to B Lassiter for Dr Royann Shivers to review and sign. lp

## 2015-03-31 ENCOUNTER — Telehealth: Payer: Self-pay | Admitting: Cardiovascular Disease

## 2015-03-31 NOTE — Telephone Encounter (Signed)
Received signed and completed FMLA form ([employer]) on 03/31/15.  Contacted patient and advised FMLA form was completed, faxed to [employer] and mailed patient copy. 03/31/15 lp

## 2015-04-09 ENCOUNTER — Telehealth: Payer: Self-pay | Admitting: Cardiovascular Disease

## 2015-04-09 NOTE — Telephone Encounter (Signed)
Per OV note, patient was to increase lisinopril to 20mg  BID - this Rx was sent to pharmacy 4/25 Left voicemail for patient with this information.

## 2015-04-09 NOTE — Telephone Encounter (Signed)
LM for patient with info per Dr. Salena Saner

## 2015-04-09 NOTE — Telephone Encounter (Signed)
Pt called in stating that the last time he was in the office Dr. C wanted to increase his dosage of Lisinopril to 40mg  BID. Can the new prescription be called into the [facility] in [place]  Thanks

## 2015-04-09 NOTE — Telephone Encounter (Signed)
Spoke with patient - he reports he saw APPs and his lisinopril was adjusted. He reports he increased his lisinopril to 20mg  BID after his last APP visit as he states he was told informally to increase this after 2 weeks - per Vernona Rieger, NP note patient was to take lisinopril 10mg  BID but there was no mention of increasing this further. With this, it appears that when he saw Dr. Salena Saner he was already taking lisinopril 20mg  BID and has now increased this dose to 40mg  BID (he was told to double it).   He reports he does not check his BP regularly but has not felt any different since making this medication change.   Will defer to Dr. Royann Shivers to review and advise on medications.

## 2015-04-09 NOTE — Telephone Encounter (Signed)
Would return to lisinopril 40 mg Daily. No benefit from increasing to 40 mg twice daily

## 2015-04-11 ENCOUNTER — Ambulatory Visit: Payer: BLUE CROSS/BLUE SHIELD | Admitting: Cardiovascular Disease

## 2015-06-02 ENCOUNTER — Encounter: Payer: Self-pay | Admitting: *Deleted

## 2015-06-02 ENCOUNTER — Ambulatory Visit (INDEPENDENT_AMBULATORY_CARE_PROVIDER_SITE_OTHER): Payer: BLUE CROSS/BLUE SHIELD | Admitting: Cardiovascular Disease

## 2015-06-02 ENCOUNTER — Encounter: Payer: Self-pay | Admitting: Cardiovascular Disease

## 2015-06-02 VITALS — BP 128/76 | HR 71 | Resp 16 | Ht 70.0 in | Wt 281.8 lb

## 2015-06-02 DIAGNOSIS — I429 Cardiomyopathy, unspecified: Secondary | ICD-10-CM | POA: Diagnosis not present

## 2015-06-02 MED ORDER — CARVEDILOL 25 MG PO TABS: ORAL_TABLET | ORAL | Status: DC

## 2015-06-02 NOTE — Patient Instructions (Signed)
Your physician recommends that you schedule a follow-up appointment AFTER ECHO COMPLETE  INCREASE CARVEDILOL TO 12.5 MG TWICE DAILY X 3 WEEKS  AFTER 3 WEEKS INCREASE CARVEDILOL TO 25 MG TWICE DAILY  Your physician has requested that you have an echocardiogram. Echocardiography is a painless test that uses sound waves to create images of your heart. It provides your doctor with information about the size and shape of your heart and how well your heart's chambers and valves are working. This procedure takes approximately one hour. There are no restrictions for this procedure.SCHEDULE IN 6 WEEKS

## 2015-06-02 NOTE — Progress Notes (Signed)
Patient ID: Andrew Harmon, male   DOB: 17-Aug-1986, 29 y.o.   MRN: [id number]     Cardiology Office Note   Date:  06/02/2015   ID:  Andrew Harmon, DOB Nov 19, 1985, MRN [id number]  PCP:  No PCP Per Patient  Cardiologist:   Thurmon Fair, MD   Chief Complaint  Patient presents with  . Follow-up    patient reports no complaints      History of Present Illness: Andrew Harmon is a 29 y.o. male who presents in f/u for dilated cardiomyopathy, which seemed to follow initial presentation with acute pericarditis one year earlier.   he has tolerated titration of medications for heart failure well. He is now on 40 mg daily of lisinopril. He noticed some listlessness and lack of energy when he increased the dose of carvedilol but has returned to baseline. He has not expressed palpitations, syncope , exertional dyspnea, lower extremity edema or other features to suggest exacerbation of heart failure. He does not require loop diuretics. He is actively trying to stop smoking.  Past Medical History  Diagnosis Date  . Asthma     Past Surgical History  Procedure Laterality Date  . Pericardial window N/A 12/11/2013    Procedure: PERICARDIAL WINDOW;  Surgeon: Delight Ovens, MD;  Location: [facility] OR;  Service: Thoracic;  Laterality: N/A;  W/ TEE  . Intraoperative transesophageal echocardiogram N/A 12/11/2013    Procedure: INTRAOPERATIVE TRANSESOPHAGEAL ECHOCARDIOGRAM;  Surgeon: Delight Ovens, MD;  Location: [facility] OR;  Service: Open Heart Surgery;  Laterality: N/A;     Current Outpatient Prescriptions  Medication Sig Dispense Refill  . carvedilol (COREG) 6.25 MG tablet Take 1 tablet (6.25 mg total) by mouth 2 (two) times daily. 60 tablet 11  . ibuprofen (ADVIL,MOTRIN) 600 MG tablet Take 1 tablet (600 mg total) by mouth 3 (three) times daily. (Patient taking differently: Take 600 mg by mouth every 8 (eight) hours as needed. ) 42 tablet 0  . lisinopril (PRINIVIL,ZESTRIL) 20 MG tablet Take 1  tablet (20 mg total) by mouth 2 (two) times daily. 60 tablet 11  . carvedilol (COREG) 25 MG tablet TAKE 1/2 TABLET TWICE DAILY X 3 WEEKS THEN INCREASE TO ONE TABLET TWICE DAILY 180 tablet 3   No current facility-administered medications for this visit.    Allergies:   Amoxicillin; Penicillins; and Colchicine    Social History:  The patient  reports that he has been smoking.  He does not have any smokeless tobacco history on file. He reports that he drinks alcohol. He reports that he does not use illicit drugs.   Family History:  The patient's family history includes Diabetes in his maternal grandmother and mother; Healthy in his sister; Heart disease in his maternal grandmother.    ROS:  Please see the history of present illness.    Otherwise, review of systems positive for none.   All other systems are reviewed and negative.    PHYSICAL EXAM: VS:  BP 128/76 mmHg  Pulse 71  Resp 16  Ht 5\' 10"  (1.778 m)  Wt 281 lb 12.8 oz (127.824 kg)  BMI 40.43 kg/m2 , BMI Body mass index is 40.43 kg/(m^2).  General: Alert, oriented x3, no distress Head: no evidence of trauma, PERRL, EOMI, no exophtalmos or lid lag, no myxedema, no xanthelasma; normal ears, nose and oropharynx Neck: normal jugular venous pulsations and no hepatojugular reflux; brisk carotid pulses without delay and no carotid bruits Chest: clear to auscultation, no signs of consolidation by percussion  or palpation, normal fremitus, symmetrical and full respiratory excursions Cardiovascular: normal position and quality of the apical impulse, regular rhythm, normal first and second heart sounds, no murmurs, rubs or gallops Abdomen: no tenderness or distention, no masses by palpation, no abnormal pulsatility or arterial bruits, normal bowel sounds, no hepatosplenomegaly Extremities: no clubbing, cyanosis or edema; 2+ radial, ulnar and brachial pulses bilaterally; 2+ right femoral, posterior tibial and dorsalis pedis pulses; 2+ left  femoral, posterior tibial and dorsalis pedis pulses; no subclavian or femoral bruits Neurological: grossly nonfocal Psych: euthymic mood, full affect   EKG:  EKG is ordered today. The ekg ordered today demonstrates NSR   Recent Labs: 01/09/2015: ALT 47 02/12/2015: BUN 9; Creat 0.82; Potassium 4.1; Sodium 137    Lipid Panel No results found for: CHOL, TRIG, HDL, CHOLHDL, VLDL, LDLCALC, LDLDIRECT    Wt Readings from Last 3 Encounters:  06/02/15 281 lb 12.8 oz (127.824 kg)  02/17/15 278 lb 3.2 oz (126.191 kg)  02/06/15 274 lb 14.4 oz (124.694 kg)     ASSESSMENT AND PLAN:   we'll continue with titration of beta blocker dose to the maximum target of 25 mg twice a day. He will increase to 12.5 mg twice a day starting now and is well-tolerated in 3 weeks will increase to the maximum dose. A few more weeks after that we'll repeat evaluation of left ventricular systolic function with echocardiography. Again reviewed the fact that he may have a temporary worsening of stamina , some dizziness earlier worsening dyspnea or edema when we increased the dose of beta blocker. If the symptoms are severe he should call right away. If the symptoms are only mild , stick with the plan of increased dose , anticipating gradual return to baseline over. Of a week or 2.    Current medicines are reviewed at length with the patient today.  The patient does not have concerns regarding medicines.  Labs/ tests ordered today include:   Orders Placed This Encounter  Procedures  . EKG 12-Lead  . Echocardiogram     Patient Instructions  Your physician recommends that you schedule a follow-up appointment AFTER ECHO COMPLETE  INCREASE CARVEDILOL TO 12.5 MG TWICE DAILY X 3 WEEKS  AFTER 3 WEEKS INCREASE CARVEDILOL TO 25 MG TWICE DAILY  Your physician has requested that you have an echocardiogram. Echocardiography is a painless test that uses sound waves to create images of your heart. It provides your doctor  with information about the size and shape of your heart and how well your heart's chambers and valves are working. This procedure takes approximately one hour. There are no restrictions for this procedure.SCHEDULE IN 6 WEEKS         Signed, Josh Nicolosi, MD  06/02/2015 3:12 PM    Thurmon Fair, MD, [facility] [phone] office [phone] pager

## 2015-07-14 ENCOUNTER — Ambulatory Visit (HOSPITAL_COMMUNITY): Payer: BLUE CROSS/BLUE SHIELD | Attending: Cardiology

## 2015-07-14 ENCOUNTER — Other Ambulatory Visit: Payer: Self-pay

## 2015-07-14 DIAGNOSIS — I071 Rheumatic tricuspid insufficiency: Secondary | ICD-10-CM | POA: Insufficient documentation

## 2015-07-14 DIAGNOSIS — I429 Cardiomyopathy, unspecified: Secondary | ICD-10-CM | POA: Diagnosis not present

## 2015-07-14 DIAGNOSIS — I340 Nonrheumatic mitral (valve) insufficiency: Secondary | ICD-10-CM | POA: Insufficient documentation

## 2015-07-26 DIAGNOSIS — I514 Myocarditis, unspecified: Secondary | ICD-10-CM

## 2015-07-26 HISTORY — DX: Myocarditis, unspecified: I51.4

## 2015-08-18 ENCOUNTER — Encounter: Payer: Self-pay | Admitting: Cardiovascular Disease

## 2015-08-18 ENCOUNTER — Ambulatory Visit (INDEPENDENT_AMBULATORY_CARE_PROVIDER_SITE_OTHER): Payer: BLUE CROSS/BLUE SHIELD | Admitting: Cardiovascular Disease

## 2015-08-18 VITALS — BP 110/76 | HR 69 | Ht 70.0 in | Wt 286.0 lb

## 2015-08-18 DIAGNOSIS — I3139 Other pericardial effusion (noninflammatory): Secondary | ICD-10-CM

## 2015-08-18 DIAGNOSIS — I309 Acute pericarditis, unspecified: Secondary | ICD-10-CM | POA: Insufficient documentation

## 2015-08-18 DIAGNOSIS — I319 Disease of pericardium, unspecified: Secondary | ICD-10-CM | POA: Diagnosis not present

## 2015-08-18 DIAGNOSIS — I313 Pericardial effusion (noninflammatory): Secondary | ICD-10-CM

## 2015-08-18 DIAGNOSIS — I420 Dilated cardiomyopathy: Secondary | ICD-10-CM | POA: Diagnosis not present

## 2015-08-18 NOTE — Patient Instructions (Signed)
Dr. Croitoru recommends that you schedule a follow-up appointment in: ONE YEAR   

## 2015-08-18 NOTE — Progress Notes (Signed)
Patient ID: Andrew Harmon, male   DOB: 05/26/1986, 29 y.o.   MRN: [id number]     Cardiology Office Note   Date:  08/18/2015   ID:  Andrew Harmon, DOB 03-12-1986, MRN [id number]  PCP:  No PCP Per Patient  Cardiologist:   Thurmon Fair, MD   Chief Complaint  Patient presents with  . 2 MONTH FOLLOW UP      History of Present Illness: Andrew Harmon is a 29 y.o. male who presents for  Follow-up of left ventricular myocardial dysfunction , that became apparent following presentation with acute pericarditis that required a pericardial window in seborrheic 2015. Although the symptoms of pericarditis resolved, he had persistent and worsening left ventricular systolic dysfunction. Ace inhibitors and beta blockers were gradually titrated up and his most recent echocardiogram shows complete normalization of left ventricular systolic function. Retrospectively, it appears most likely that he had a unusually protracted episode of acute myopericarditis. I am not certain whether his left ventricular dysfunction resolved as of treatment , or simply because of the resolution of myocarditis. He feels well and does not have any symptoms of congestive heart failure or arrhythmia. He denies pleuritic or anginal chest pain. He does not have any side effects from the cardiac medication.    Past Medical History  Diagnosis Date  . Asthma     Past Surgical History  Procedure Laterality Date  . Pericardial window N/A 12/11/2013    Procedure: PERICARDIAL WINDOW;  Surgeon: Delight Ovens, MD;  Location: [facility] OR;  Service: Thoracic;  Laterality: N/A;  W/ TEE  . Intraoperative transesophageal echocardiogram N/A 12/11/2013    Procedure: INTRAOPERATIVE TRANSESOPHAGEAL ECHOCARDIOGRAM;  Surgeon: Delight Ovens, MD;  Location: [facility] OR;  Service: Open Heart Surgery;  Laterality: N/A;     Current Outpatient Prescriptions  Medication Sig Dispense Refill  . carvedilol (COREG) 6.25 MG tablet Take 1 tablet  (6.25 mg total) by mouth 2 (two) times daily. 60 tablet 11  . ibuprofen (ADVIL,MOTRIN) 600 MG tablet Take 1 tablet (600 mg total) by mouth 3 (three) times daily. (Patient taking differently: Take 600 mg by mouth every 8 (eight) hours as needed. ) 42 tablet 0  . lisinopril (PRINIVIL,ZESTRIL) 20 MG tablet Take 1 tablet (20 mg total) by mouth 2 (two) times daily. 60 tablet 11   No current facility-administered medications for this visit.    Allergies:   Amoxicillin; Penicillins; and Colchicine    Social History:  The patient  reports that he has been smoking.  He does not have any smokeless tobacco history on file. He reports that he drinks alcohol. He reports that he does not use illicit drugs.   Family History:  The patient's family history includes Diabetes in his maternal grandmother and mother; Healthy in his sister; Heart disease in his maternal grandmother.    ROS:  Please see the history of present illness.    Otherwise, review of systems positive for none.   All other systems are reviewed and negative.    PHYSICAL EXAM: VS:  BP 110/76 mmHg  Pulse 69  Ht  (1.778 m)  Wt 286 lb (129.729 kg)  BMI 41.04 kg/m2 , BMI Body mass index is 41.04 kg/(m^2).  General: Alert, oriented x3, no distress Head: no evidence of trauma, PERRL, EOMI, no exophtalmos or lid lag, no myxedema, no xanthelasma; normal ears, nose and oropharynx Neck: normal jugular venous pulsations and no hepatojugular reflux; brisk carotid pulses without delay and no carotid bruits Chest: clear  to auscultation, no signs of consolidation by percussion or palpation, normal fremitus, symmetrical and full respiratory excursions Cardiovascular: normal position and quality of the apical impulse, regular rhythm, normal first and second heart sounds, no murmurs, rubs or gallops Abdomen: no tenderness or distention, no masses by palpation, no abnormal pulsatility or arterial bruits, normal bowel sounds, no  hepatosplenomegaly Extremities: no clubbing, cyanosis or edema; 2+ radial, ulnar and brachial pulses bilaterally; 2+ right femoral, posterior tibial and dorsalis pedis pulses; 2+ left femoral, posterior tibial and dorsalis pedis pulses; no subclavian or femoral bruits Neurological: grossly nonfocal Psych: euthymic mood, full affect   EKG:  EKG is not ordered today.   Recent Labs: 01/09/2015: ALT 47 02/12/2015: BUN 9; Creat 0.82; Potassium 4.1; Sodium 137    Lipid Panel No results found for: CHOL, TRIG, HDL, CHOLHDL, VLDL, LDLCALC, LDLDIRECT    Wt Readings from Last 3 Encounters:  08/18/15 286 lb (129.729 kg)  06/02/15 281 lb 12.8 oz (127.824 kg)  02/17/15 278 lb 3.2 oz (126.191 kg)    .   ASSESSMENT AND PLAN:  Wil probably had a lengthy episode of acute myocarditis following his initial presentation with acute pericarditis and a large pericardial effusion. Whether due to spontaneous resolution of the inflammatory disorder or due to treatment with ace inhibitors or beta blockers , left ventricular function has completely returned to normal. I would recommend continued treatment with his current medications for about another year and then we can gradually withdraw Ace inhibitors and beta blockers, with echo follow-up. His prognosis is likely to be excellent.   Luthur is morbidly obese and he is strongly encouraged lose weight.    Current medicines are reviewed at length with the patient today.  The patient does not have concerns regarding medicines.  The following changes have been made:  no change  Labs/ tests ordered today include:  No orders of the defined types were placed in this encounter.    Patient Instructions  Dr. Royann Shivers recommends that you schedule a follow-up appointment in: ONE YEAR    SignedThurmon Fair, MD  08/18/2015 9:49 PM    Thurmon Fair, MD, [facility] [phone] office [phone] pager

## 2015-09-01 ENCOUNTER — Ambulatory Visit
Admission: EM | Admit: 2015-09-01 | Discharge: 2015-09-01 | Disposition: A | Discharge status: Home / Self Care | Payer: BLUE CROSS/BLUE SHIELD | Attending: Family Medicine | Admitting: Family Medicine

## 2015-09-01 DIAGNOSIS — J029 Acute pharyngitis, unspecified: Secondary | ICD-10-CM

## 2015-09-01 DIAGNOSIS — J069 Acute upper respiratory infection, unspecified: Secondary | ICD-10-CM

## 2015-09-01 HISTORY — DX: Myocarditis, unspecified: I51.4

## 2015-09-01 HISTORY — DX: Disease of pericardium, unspecified: I31.9

## 2015-09-01 LAB — RAPID STREP SCREEN (MED CTR MEBANE ONLY): STREPTOCOCCUS, GROUP A SCREEN (DIRECT): NEGATIVE

## 2015-09-01 LAB — RAPID INFLUENZA A&B ANTIGENS: Influenza B (ARMC): NOT DETECTED

## 2015-09-01 LAB — RAPID INFLUENZA A&B ANTIGENS (ARMC ONLY): INFLUENZA A (ARMC): NOT DETECTED

## 2015-09-01 MED ORDER — AZITHROMYCIN 250 MG PO TABS: ORAL_TABLET | ORAL | Status: DC

## 2015-09-01 NOTE — Discharge Instructions (Signed)
Take medication as prescribed. Rest. Drink plenty of fluids. Take Tylenol as needed.  As discussed follow-up with her primary care physician this week. Close follow-up is important. Return to urgent care proceed to the ER for chest pain, shortness of breath, dizziness, fever, abdominal pain, wheezing, any chest discomfort, new or worsening concerns.   Pharyngitis Pharyngitis is redness, pain, and swelling (inflammation) of your pharynx.  CAUSES  Pharyngitis is usually caused by infection. Most of the time, these infections are from viruses (viral) and are part of a cold. However, sometimes pharyngitis is caused by bacteria (bacterial). Pharyngitis can also be caused by allergies. Viral pharyngitis may be spread from person to person by coughing, sneezing, and personal items or utensils (cups, forks, spoons, toothbrushes). Bacterial pharyngitis may be spread from person to person by more intimate contact, such as kissing.  SIGNS AND SYMPTOMS  Symptoms of pharyngitis include:   Sore throat.   Tiredness (fatigue).   Low-grade fever.   Headache.  Joint pain and muscle aches.  Skin rashes.  Swollen lymph nodes.  Plaque-like film on throat or tonsils (often seen with bacterial pharyngitis). DIAGNOSIS  Your health care provider will ask you questions about your illness and your symptoms. Your medical history, along with a physical exam, is often all that is needed to diagnose pharyngitis. Sometimes, a rapid strep test is done. Other lab tests may also be done, depending on the suspected cause.  TREATMENT  Viral pharyngitis will usually get better in 3-4 days without the use of medicine. Bacterial pharyngitis is treated with medicines that kill germs (antibiotics).  HOME CARE INSTRUCTIONS   Drink enough water and fluids to keep your urine clear or pale yellow.   Only take over-the-counter or prescription medicines as directed by your health care provider:   If you are prescribed  antibiotics, make sure you finish them even if you start to feel better.   Do not take aspirin.   Get lots of rest.   Gargle with 8 oz of salt water ( tsp of salt per 1 qt of water) as often as every 1-2 hours to soothe your throat.   Throat lozenges (if you are not at risk for choking) or sprays may be used to soothe your throat. SEEK MEDICAL CARE IF:   You have large, tender lumps in your neck.  You have a rash.  You cough up green, yellow-brown, or bloody spit. SEEK IMMEDIATE MEDICAL CARE IF:   Your neck becomes stiff.  You drool or are unable to swallow liquids.  You vomit or are unable to keep medicines or liquids down.  You have severe pain that does not go away with the use of recommended medicines.  You have trouble breathing (not caused by a stuffy nose). MAKE SURE YOU:   Understand these instructions.  Will watch your condition.  Will get help right away if you are not doing well or get worse.   This information is not intended to replace advice given to you by your health care provider. Make sure you discuss any questions you have with your health care provider.   Document Released: 10/11/2005 Document Revised: 08/01/2013 Document Reviewed: 06/18/2013 Elsevier Interactive Patient Education 2016 [employer] Inc[employer]  Upper Respiratory Infection, Adult Most upper respiratory infections (URIs) are a viral infection of the air passages leading to the lungs. A URI affects the nose, throat, and upper air passages. The most common type of URI is nasopharyngitis and is typically referred to as "the common cold."  URIs run their course and usually go away on their own. Most of the time, a URI does not require medical attention, but sometimes a bacterial infection in the upper airways can follow a viral infection. This is called a secondary infection. Sinus and middle ear infections are common types of secondary upper respiratory infections. Bacterial pneumonia can also  complicate a URI. A URI can worsen asthma and chronic obstructive pulmonary disease (COPD). Sometimes, these complications can require emergency medical care and may be life threatening.  CAUSES Almost all URIs are caused by viruses. A virus is a type of germ and can spread from one person to another.  RISKS FACTORS You may be at risk for a URI if:   You smoke.   You have chronic heart or lung disease.  You have a weakened defense (immune) system.   You are very young or very old.   You have nasal allergies or asthma.  You work in crowded or poorly ventilated areas.  You work in health care facilities or schools. SIGNS AND SYMPTOMS  Symptoms typically develop 2-3 days after you come in contact with a cold virus. Most viral URIs last 7-10 days. However, viral URIs from the influenza virus (flu virus) can last 14-18 days and are typically more severe. Symptoms may include:   Runny or stuffy (congested) nose.   Sneezing.   Cough.   Sore throat.   Headache.   Fatigue.   Fever.   Loss of appetite.   Pain in your forehead, behind your eyes, and over your cheekbones (sinus pain).  Muscle aches.  DIAGNOSIS  Your health care provider may diagnose a URI by:  Physical exam.  Tests to check that your symptoms are not due to another condition such as:  Strep throat.  Sinusitis.  Pneumonia.  Asthma. TREATMENT  A URI goes away on its own with time. It cannot be cured with medicines, but medicines may be prescribed or recommended to relieve symptoms. Medicines may help:  Reduce your fever.  Reduce your cough.  Relieve nasal congestion. HOME CARE INSTRUCTIONS   Take medicines only as directed by your health care provider.   Gargle warm saltwater or take cough drops to comfort your throat as directed by your health care provider.  Use a warm mist humidifier or inhale steam from a shower to increase air moisture. This may make it easier to  breathe.  Drink enough fluid to keep your urine clear or pale yellow.   Eat soups and other clear broths and maintain good nutrition.   Rest as needed.   Return to work when your temperature has returned to normal or as your health care provider advises. You may need to stay home longer to avoid infecting others. You can also use a face mask and careful hand washing to prevent spread of the virus.  Increase the usage of your inhaler if you have asthma.   Do not use any tobacco products, including cigarettes, chewing tobacco, or electronic cigarettes. If you need help quitting, ask your health care provider. PREVENTION  The best way to protect yourself from getting a cold is to practice good hygiene.   Avoid oral or hand contact with people with cold symptoms.   Wash your hands often if contact occurs.  There is no clear evidence that vitamin C, vitamin E, echinacea, or exercise reduces the chance of developing a cold. However, it is always recommended to get plenty of rest, exercise, and practice good nutrition.  SEEK MEDICAL CARE IF:   You are getting worse rather than better.   Your symptoms are not controlled by medicine.   You have chills.  You have worsening shortness of breath.  You have brown or red mucus.  You have yellow or brown nasal discharge.  You have pain in your face, especially when you bend forward.  You have a fever.  You have swollen neck glands.  You have pain while swallowing.  You have white areas in the back of your throat. SEEK IMMEDIATE MEDICAL CARE IF:   You have severe or persistent:  Headache.  Ear pain.  Sinus pain.  Chest pain.  You have chronic lung disease and any of the following:  Wheezing.  Prolonged cough.  Coughing up blood.  A change in your usual mucus.  You have a stiff neck.  You have changes in your:  Vision.  Hearing.  Thinking.  Mood. MAKE SURE YOU:   Understand these  instructions.  Will watch your condition.  Will get help right away if you are not doing well or get worse.   This information is not intended to replace advice given to you by your health care provider. Make sure you discuss any questions you have with your health care provider.   Document Released: 04/06/2001 Document Revised: 02/25/2015 Document Reviewed: 01/16/2014 Elsevier Interactive Patient Education [employer]

## 2015-09-01 NOTE — ED Notes (Signed)
Started yesterday with sinus drainage, post nasal drip and cough. Denies fever.

## 2015-09-01 NOTE — ED Provider Notes (Signed)
[facility]  ____________________________________________  Time seen: Approximately 9:18 AM  I have reviewed the triage vital signs and the nursing notes.   HISTORY  Chief Complaint URI   HPI Andrew Harmon is a 29 y.o. malepresents for complaints of sore throat, runny nose, congestion since yesterday. Reports continues to eat and drink fluids well. Denies fevers. Reports several coworkers with similar. States believes coworker with strep throat. States an occasional cough. Reports continues to eat and drink well. Denies fever. Denies chest congestion, shortness of breath, chest pain or abdominal pain. Denies exertional shortness of breath or chest pain with sitting forward. Denies wheezes.States has not taken any medications over the counter.   Patient reports main complaint is sore throat. States does hurt to swallow. States current sore throat pain is 6 out of 10 aching and sore. Denies recent sickness. Denies recent antibiotic use. Denies sharing drinks or foods with others.   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 Reports that he has continued to follow with cardiology at [facility].  Statesat this time cardiology has continue him on Coreg and lisinopril. States he has been cleared from from cardiology until one year. States that time he will just have a follow-up.    Past Medical History  Diagnosis Date  . Pericarditis 11/2013  . 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 Asthma     Patient Active Problem List   Diagnosis Date Noted  . Acute myopericarditis 08/18/2015  . Congestive dilated cardiomyopathy (HCC) 01/21/2015  . Acute pericarditis, unspecified 12/12/2013  . Pericardial effusion- S/P pericardial window 12/11/13 12/10/2013    Past Surgical History  Procedure Laterality Date  . Pericardial window N/A 12/11/2013    Procedure: PERICARDIAL WINDOW;  Surgeon: Delight Ovens, MD;  Location: [facility] OR;  Service:  Thoracic;  Laterality: N/A;  W/ TEE  . Intraoperative transesophageal echocardiogram N/A 12/11/2013    Procedure: INTRAOPERATIVE TRANSESOPHAGEAL ECHOCARDIOGRAM;  Surgeon: Delight Ovens, MD;  Location: [facility] OR;  Service: Open Heart Surgery;  Laterality: N/A;    Current Outpatient Rx  Name  Route  Sig  Dispense  Refill  . carvedilol (COREG) 6.25 MG tablet   Oral   Take 1 tablet (6.25 mg total) by mouth 2 (two) times daily.   60 tablet   11   . lisinopril (PRINIVIL,ZESTRIL) 20 MG tablet   Oral   Take 1 tablet (20 mg total) by mouth 2 (two) times daily.   60 tablet   11   . ibuprofen (ADVIL,MOTRIN) 600 MG tablet   Oral   Take 1 tablet (600 mg total) by mouth 3 (three) times daily. Patient taking differently: Take 600 mg by mouth every 8 (eight) hours as needed.    42 tablet   0     Allergies Amoxicillin; Penicillins; and Colchicine  Family History  Problem Relation Age of Onset  . Diabetes Mother   . Healthy Sister   . Heart disease Maternal Grandmother   . Diabetes Maternal Grandmother     Social History Social History  Substance Use Topics  . Smoking status: Current Every Day Smoker -- 1.00 packs/day    Types: Cigarettes  . Smokeless tobacco: None  . Alcohol Use: Yes     Comment: socially    Review of Systems Constitutional: No fever/chills Eyes: No visual changes. ENT: Positive sore throat, runny nose and congestion. Cardiovascular: Denies chest pain. Respiratory: Denies shortness of breath. Gastrointestinal: No abdominal pain.  No nausea, no vomiting.  No diarrhea.  No constipation. Genitourinary: Negative for dysuria. Musculoskeletal: Negative for back pain. Skin: Negative for rash. Neurological: Negative for headaches, focal weakness or numbness.  10-point ROS otherwise negative.  ____________________________________________   PHYSICAL EXAM:  VITAL SIGNS: ED Triage Vitals  Enc Vitals Group     BP 09/01/15 0838 115/64 mmHg     Pulse Rate  09/01/15 0838 80     Resp 09/01/15 0838 17     Temp 09/01/15 0838 97.3 F (36.3 C)     Temp Source 09/01/15 0838 Tympanic     SpO2 09/01/15 0838 99 %     Weight 09/01/15 0838 285 lb (129.275 kg)     Height 09/01/15 0838  (1.778 m)     Head Cir --      Peak Flow --      Pain Score --      Pain Loc --      Pain Edu? --      Excl. in GC? --    Constitutional: Alert and oriented. Well appearing and in no acute distress. Eyes: Conjunctivae are normal. PERRL. EOMI. Head: Atraumatic.nontender sinuses, no swelling, no erythema.   Ears: no erythema, normal TMs bilaterally.   Nose: minimal clear rhinorrhea.   Mouth/Throat: Mucous membranes are moist.  Moderate pharyngeal with 2+ tonsillar swelling, no exudate, no uvular shift or deviation.  Neck: No stridor.  No cervical spine tenderness to palpation. Hematological/Lymphatic/Immunilogical: No cervical lymphadenopathy. Cardiovascular: Normal rate, regular rhythm. Grossly normal heart sounds.  Good peripheral circulation. Respiratory: Normal respiratory effort.  No retractions. Lungs CTAB.No wheezes, rales or rhonchi. Good air movement. Speaking in complete sentences.  Gastrointestinal: Soft and nontender. No distention. Normal Bowel sounds.  No abdominal bruits. No CVA tenderness. No hepatomegaly. No splenomegaly.  Musculoskeletal: No lower or upper extremity tenderness nor edema.  No joint effusions. Bilateral pedal pulses equal and easily palpated.  Neurologic:  Normal speech and language. No gross focal neurologic deficits are appreciated. No gait instability. Skin:  Skin is warm, dry and intact. No rash noted. Psychiatric: Mood and affect are normal. Speech and behavior are normal.  ____________________________________________   LABS (all labs ordered are listed, but only abnormal results are displayed)  Labs Reviewed  RAPID STREP SCREEN (NOT AT [facility]  RAPID INFLUENZA A&B ANTIGENS ([facility] ONLY)  CULTURE, GROUP A STREP ([facility] ONLY)      INITIAL IMPRESSION / ASSESSMENT AND PLAN / ED COURSE  Pertinent labs & imaging results that were available during my care of the patient were reviewed by me and considered in my medical decision making (see chart for details).  Discussed patient and plan of care with Dr Thurmond Butts, who agrees with plan. As patient afebrile, one day of symptoms, no chest pain or shortness of breath, and lungs clear no chest xray at this time.   Very well-appearing patient. No acute distress. Presents for complaint of sore throat, runny nose, nasal congestion 1 day. States occasional cough. Denies chest pain, shortness of breath, abdominal pain, wheezing, exertional shortness of breath. Reports several coworkers with similar. Well appearing. Lungs clear throughout. Vital signs stable. Afebrile. Moist mucous membranes. Normal heart sounds, no murmurs or rubs. Moderate pharyngeal erythema and mild tonsillar swelling, no tonsillar exudate. Drinking fluids well. Will evaluate strep as well as influenza.  Strep swab negative will culture. Rapid influenza testing is negative. As with chief complaint and sore throat as well as with moderate pharyngeal erythema and tonsillar swelling will culture  strep swab but will go ahead and start azithromycin as patient is penicillin allergic. Discussed with patient to make sure he follows with his primary care physician this week. Discussed with patient to return immediately to urgent care proceed to the emergency room for any complaints of chest pain, shortness of breath, wheezing, dizziness, fever or change in complaints. Discussed follow up and return parameters including no resolution or any worsening concerns. Patient verbalized understanding and agreed to plan.  Patient also request work note. Work note given.  ____________________________________________   FINAL CLINICAL IMPRESSION(S) / ED DIAGNOSES  Final diagnoses:  Pharyngitis  URI (upper respiratory infection)        Renford Dills, NP 09/01/15 1145

## 2015-09-03 LAB — CULTURE, GROUP A STREP (THRC)

## 2015-10-10 ENCOUNTER — Other Ambulatory Visit: Payer: Self-pay | Admitting: Surgery

## 2015-10-10 DIAGNOSIS — R229 Localized swelling, mass and lump, unspecified: Secondary | ICD-10-CM

## 2015-10-16 ENCOUNTER — Ambulatory Visit
Admission: RE | Admit: 2015-10-16 | Discharge: 2015-10-16 | Disposition: A | Discharge status: Home / Self Care | Payer: BLUE CROSS/BLUE SHIELD | Source: Ambulatory Visit | Attending: Surgery | Admitting: Surgery

## 2015-10-16 DIAGNOSIS — M5136 Other intervertebral disc degeneration, lumbar region: Secondary | ICD-10-CM | POA: Diagnosis not present

## 2015-10-16 DIAGNOSIS — R229 Localized swelling, mass and lump, unspecified: Secondary | ICD-10-CM | POA: Diagnosis not present

## 2015-10-16 MED ORDER — GADOBENATE DIMEGLUMINE 529 MG/ML IV SOLN
20.0000 mL | Freq: Once | INTRAVENOUS | Status: AC | PRN
  Administered 2015-10-16: 20 mL via INTRAVENOUS

## 2016-01-12 ENCOUNTER — Encounter: Payer: Self-pay | Admitting: Emergency Medicine

## 2016-01-12 ENCOUNTER — Ambulatory Visit
Admission: EM | Admit: 2016-01-12 | Discharge: 2016-01-12 | Disposition: A | Discharge status: Home / Self Care | Payer: BLUE CROSS/BLUE SHIELD | Attending: Emergency Medicine | Admitting: Emergency Medicine

## 2016-01-12 ENCOUNTER — Ambulatory Visit (INDEPENDENT_AMBULATORY_CARE_PROVIDER_SITE_OTHER): Payer: BLUE CROSS/BLUE SHIELD

## 2016-01-12 DIAGNOSIS — J069 Acute upper respiratory infection, unspecified: Secondary | ICD-10-CM | POA: Diagnosis not present

## 2016-01-12 LAB — RAPID STREP SCREEN (MED CTR MEBANE ONLY): Streptococcus, Group A Screen (Direct): NEGATIVE

## 2016-01-12 MED ORDER — HYDROCOD POLST-CPM POLST ER 10-8 MG/5ML PO SUER: 5.0000 mL | Freq: Two times a day (BID) | ORAL | Status: DC

## 2016-01-12 MED ORDER — BENZONATATE 200 MG PO CAPS: ORAL_CAPSULE | ORAL | Status: DC

## 2016-01-12 MED ORDER — FLUTICASONE PROPIONATE 50 MCG/ACT NA SUSP
2.0000 | Freq: Every day | NASAL | Status: DC
Start: 2016-01-12 — End: 2017-11-29

## 2016-01-12 NOTE — ED Notes (Signed)
Sore throat, cough for 2 days

## 2016-01-12 NOTE — Discharge Instructions (Signed)
Cool Mist Vaporizers °Vaporizers may help relieve the symptoms of a cough and cold. They add moisture to the air, which helps mucus to become thinner and less sticky. This makes it easier to breathe and cough up secretions. Cool mist vaporizers do not cause serious burns like hot mist vaporizers, which may also be called steamers or humidifiers. Vaporizers have not been proven to help with colds. You should not use a vaporizer if you are allergic to mold. °HOME CARE INSTRUCTIONS °· Follow the package instructions for the vaporizer. °· Do not use anything other than distilled water in the vaporizer. °· Do not run the vaporizer all of the time. This can cause mold or bacteria to grow in the vaporizer. °· Clean the vaporizer after each time it is used. °· Clean and dry the vaporizer well before storing it. °· Stop using the vaporizer if worsening respiratory symptoms develop. °  °This information is not intended to replace advice given to you by your health care provider. Make sure you discuss any questions you have with your health care provider. °  °Document Released: 07/08/2004 Document Revised: 10/16/2013 Document Reviewed: 02/28/2013 °Elsevier Interactive Patient Education ©2016 [employer] ° °Upper Respiratory Infection, Adult °Most upper respiratory infections (URIs) are a viral infection of the air passages leading to the lungs. A URI affects the nose, throat, and upper air passages. The most common type of URI is nasopharyngitis and is typically referred to as "the common cold." °URIs run their course and usually go away on their own. Most of the time, a URI does not require medical attention, but sometimes a bacterial infection in the upper airways can follow a viral infection. This is called a secondary infection. Sinus and middle ear infections are common types of secondary upper respiratory infections. °Bacterial pneumonia can also complicate a URI. A URI can worsen asthma and chronic obstructive  pulmonary disease (COPD). Sometimes, these complications can require emergency medical care and may be life threatening.  °CAUSES °Almost all URIs are caused by viruses. A virus is a type of germ and can spread from one person to another.  °RISKS FACTORS °You may be at risk for a URI if:  °· You smoke.   °· You have chronic heart or lung disease. °· You have a weakened defense (immune) system.   °· You are very young or very old.   °· You have nasal allergies or asthma. °· You work in crowded or poorly ventilated areas. °· You work in health care facilities or schools. °SIGNS AND SYMPTOMS  °Symptoms typically develop 2-3 days after you come in contact with a cold virus. Most viral URIs last 7-10 days. However, viral URIs from the influenza virus (flu virus) can last 14-18 days and are typically more severe. Symptoms may include:  °· Runny or stuffy (congested) nose.   °· Sneezing.   °· Cough.   °· Sore throat.   °· Headache.   °· Fatigue.   °· Fever.   °· Loss of appetite.   °· Pain in your forehead, behind your eyes, and over your cheekbones (sinus pain). °· Muscle aches.   °DIAGNOSIS  °Your health care provider may diagnose a URI by: °· Physical exam. °· Tests to check that your symptoms are not due to another condition such as: °¨ Strep throat. °¨ Sinusitis. °¨ Pneumonia. °¨ Asthma. °TREATMENT  °A URI goes away on its own with time. It cannot be cured with medicines, but medicines may be prescribed or recommended to relieve symptoms. Medicines may help: °· Reduce your fever. °· Reduce   your cough. °· Relieve nasal congestion. °HOME CARE INSTRUCTIONS  °· Take medicines only as directed by your health care provider.   °· Gargle warm saltwater or take cough drops to comfort your throat as directed by your health care provider. °· Use a warm mist humidifier or inhale steam from a shower to increase air moisture. This may make it easier to breathe. °· Drink enough fluid to keep your urine clear or pale yellow.   °· Eat  soups and other clear broths and maintain good nutrition.   °· Rest as needed.   °· Return to work when your temperature has returned to normal or as your health care provider advises. You may need to stay home longer to avoid infecting others. You can also use a face mask and careful hand washing to prevent spread of the virus. °· Increase the usage of your inhaler if you have asthma.   °· Do not use any tobacco products, including cigarettes, chewing tobacco, or electronic cigarettes. If you need help quitting, ask your health care provider. °PREVENTION  °The best way to protect yourself from getting a cold is to practice good hygiene.  °· Avoid oral or hand contact with people with cold symptoms.   °· Wash your hands often if contact occurs.   °There is no clear evidence that vitamin C, vitamin E, echinacea, or exercise reduces the chance of developing a cold. However, it is always recommended to get plenty of rest, exercise, and practice good nutrition.  °SEEK MEDICAL CARE IF:  °· You are getting worse rather than better.   °· Your symptoms are not controlled by medicine.   °· You have chills. °· You have worsening shortness of breath. °· You have brown or red mucus. °· You have yellow or brown nasal discharge. °· You have pain in your face, especially when you bend forward. °· You have a fever. °· You have swollen neck glands. °· You have pain while swallowing. °· You have white areas in the back of your throat. °SEEK IMMEDIATE MEDICAL CARE IF:  °· You have severe or persistent: °¨ Headache. °¨ Ear pain. °¨ Sinus pain. °¨ Chest pain. °· You have chronic lung disease and any of the following: °¨ Wheezing. °¨ Prolonged cough. °¨ Coughing up blood. °¨ A change in your usual mucus. °· You have a stiff neck. °· You have changes in your: °¨ Vision. °¨ Hearing. °¨ Thinking. °¨ Mood. °MAKE SURE YOU:  °· Understand these instructions. °· Will watch your condition. °· Will get help right away if you are not doing well or  get worse. °  °This information is not intended to replace advice given to you by your health care provider. Make sure you discuss any questions you have with your health care provider. °  °Document Released: 04/06/2001 Document Revised: 02/25/2015 Document Reviewed: 01/16/2014 °Elsevier Interactive Patient Education ©2016 Elsevier Inc. ° °

## 2016-01-12 NOTE — ED Provider Notes (Signed)
CSN: [id number]     Arrival date & time 01/12/16  1536 History   First MD Initiated Contact with Patient 01/12/16 1800     Chief Complaint  Patient presents with  . Sore Throat   (Consider location/radiation/quality/duration/timing/severity/associated sxs/prior Treatment) HPI   30 year old gentleman who presents with a 2 day history of sore throat and cough of 5 right and green sputum. He is afebrile today and has not been running a fever or having chills. O2 sats on room air 96%.  Past Medical History  Diagnosis Date  . Pericarditis 11/2013  . 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 Asthma    Past Surgical History  Procedure Laterality Date  . Pericardial window N/A 12/11/2013    Procedure: PERICARDIAL WINDOW;  Surgeon: Delight Ovens, MD;  Location: [facility] OR;  Service: Thoracic;  Laterality: N/A;  W/ TEE  . Intraoperative transesophageal echocardiogram N/A 12/11/2013    Procedure: INTRAOPERATIVE TRANSESOPHAGEAL ECHOCARDIOGRAM;  Surgeon: Delight Ovens, MD;  Location: [facility] OR;  Service: Open Heart Surgery;  Laterality: N/A;   Family History  Problem Relation Age of Onset  . Diabetes Mother   . Healthy Sister   . Heart disease Maternal Grandmother   . Diabetes Maternal Grandmother    Social History  Substance Use Topics  . Smoking status: Current Every Day Smoker -- 1.00 packs/day    Types: Cigarettes  . Smokeless tobacco: None  . Alcohol Use: Yes     Comment: socially    Review of Systems  Constitutional: Positive for activity change. Negative for fever, chills and fatigue.  HENT: Positive for congestion, postnasal drip and sore throat.   Respiratory: Positive for cough, shortness of breath and wheezing.   All other systems reviewed and are negative.   Allergies  Amoxicillin; Penicillins; and Colchicine  Home Medications   Prior to Admission medications   Medication Sig Start Date End Date Taking? Authorizing Provider  azithromycin (ZITHROMAX Z-PAK) 250 MG tablet Take 2  tablets (500 mg) on  Day 1,  followed by 1 tablet (250 mg) once daily on Days 2 through 5. 09/01/15   Renford Dills, NP  benzonatate (TESSALON) 200 MG capsule Take one cap TID PRN cough 01/12/16   Lutricia Feil, PA-C  carvedilol (COREG) 6.25 MG tablet Take 1 tablet (6.25 mg total) by mouth 2 (two) times daily. 02/17/15   Mihai Croitoru, MD  chlorpheniramine-HYDROcodone (TUSSIONEX PENNKINETIC ER) 10-8 MG/5ML SUER Take 5 mLs by mouth 2 (two) times daily. 01/12/16   Lutricia Feil, PA-C  fluticasone (FLONASE) 50 MCG/ACT nasal spray Place 2 sprays into both nostrils daily. 01/12/16   Lutricia Feil, PA-C  ibuprofen (ADVIL,MOTRIN) 600 MG tablet Take 1 tablet (600 mg total) by mouth 3 (three) times daily. Patient taking differently: Take 600 mg by mouth every 8 (eight) hours as needed.  12/14/13   Lonia Blood, MD  lisinopril (PRINIVIL,ZESTRIL) 20 MG tablet Take 1 tablet (20 mg total) by mouth 2 (two) times daily. 02/17/15   Mihai Croitoru, MD   Meds Ordered and Administered this Visit  Medications - No data to display  BP 105/56 mmHg  Pulse 84  Temp(Src) 98.4 F (36.9 C) (Tympanic)  Resp 18  Ht 5\' 10"  (1.778 m)  Wt 285 lb (129.275 kg)  BMI 40.89 kg/m2  SpO2 96% No data found.   Physical Exam  Constitutional: He is oriented to person, place, and time. He appears well-developed and well-nourished. No distress.  HENT:  Head: Normocephalic and atraumatic.  Right Ear: External ear normal.  Left Ear: External ear normal.  Nose: Nose normal.  Mouth/Throat: Oropharynx is clear and moist. No oropharyngeal exudate.  Eyes: Conjunctivae are normal. Pupils are equal, round, and reactive to light.  Neck: Normal range of motion. Neck supple.  Pulmonary/Chest: Effort normal. He has wheezes. He has rales.  Examination of the lungs shows crackles in the right middle lobe. He has scattered wheezes actually clear with coughing. Crackles are non-tussive  Musculoskeletal: Normal range of motion. He  exhibits no edema or tenderness.  Lymphadenopathy:    He has no cervical adenopathy.  Neurological: He is alert and oriented to person, place, and time.  Skin: Skin is warm and dry. He is not diaphoretic.  Psychiatric: He has a normal mood and affect. His behavior is normal. Judgment and thought content normal.  Nursing note and vitals reviewed.   ED Course  Procedures (including critical care time)  Labs Review Labs Reviewed  RAPID STREP SCREEN (NOT AT [facility])  CULTURE, GROUP A STREP [facility])    Imaging Review Dg Chest 2 View  01/12/2016  CLINICAL DATA:  Dry cough since yesterday. Some shortness of breath. Smoker. EXAM: CHEST  2 VIEW COMPARISON:  Radiograph 12/14/2013 and 12/13/2013. FINDINGS: The heart size and mediastinal contours are stable. There has been interval clearing of the left lung base. The lungs are now clear. There is no pleural effusion or pneumothorax. No acute osseous findings are seen. IMPRESSION: No active cardiopulmonary process. Electronically Signed   By: Carey Bullocks M.D.   On: 01/12/2016 18:20     Visual Acuity Review  Right Eye Distance:   Left Eye Distance:   Bilateral Distance:    Right Eye Near:   Left Eye Near:    Bilateral Near:         MDM   1. Acute URI    New Prescriptions   BENZONATATE (TESSALON) 200 MG CAPSULE    Take one cap TID PRN cough   CHLORPHENIRAMINE-HYDROCODONE (TUSSIONEX PENNKINETIC ER) 10-8 MG/5ML SUER    Take 5 mLs by mouth 2 (two) times daily.   FLUTICASONE (FLONASE) 50 MCG/ACT NASAL SPRAY    Place 2 sprays into both nostrils daily.  Plan: 1. Test/x-ray results and diagnosis reviewed with patient 2. rx as per orders; risks, benefits, potential side effects reviewed with patient 3. Recommend supportive treatment with Rest and fluids as necessary. Have him use Flonase for drainage and provide him with symptomatic relief from his cough. I told him this is most likely a virus and will have to run its course. There is any  problems or worsens he should follow-up with primary care physician 4. F/u prn if symptoms worsen or don't improve     Lutricia Feil, PA-C 01/12/16 1848

## 2016-01-14 LAB — CULTURE, GROUP A STREP (THRC)

## 2016-03-05 ENCOUNTER — Encounter: Payer: Self-pay | Admitting: Emergency Medicine

## 2016-03-05 ENCOUNTER — Ambulatory Visit
Admission: EM | Admit: 2016-03-05 | Discharge: 2016-03-05 | Disposition: A | Discharge status: Home / Self Care | Payer: BLUE CROSS/BLUE SHIELD | Attending: Internal Medicine | Admitting: Internal Medicine

## 2016-03-05 DIAGNOSIS — J40 Bronchitis, not specified as acute or chronic: Secondary | ICD-10-CM | POA: Diagnosis not present

## 2016-03-05 MED ORDER — ALBUTEROL SULFATE HFA 108 (90 BASE) MCG/ACT IN AERS: 2.0000 | INHALATION_SPRAY | RESPIRATORY_TRACT | Status: DC | PRN

## 2016-03-05 MED ORDER — AZITHROMYCIN 250 MG PO TABS: 250.0000 mg | ORAL_TABLET | Freq: Every day | ORAL | Status: DC

## 2016-03-05 MED ORDER — BENZONATATE 100 MG PO CAPS: 100.0000 mg | ORAL_CAPSULE | Freq: Three times a day (TID) | ORAL | Status: DC

## 2016-03-05 NOTE — Discharge Instructions (Signed)
Upper Respiratory Infection, Adult °Most upper respiratory infections (URIs) are caused by a virus. A URI affects the nose, throat, and upper air passages. The most common type of URI is often called "the common cold." °HOME CARE  °· Take medicines only as told by your doctor. °· Gargle warm saltwater or take cough drops to comfort your throat as told by your doctor. °· Use a warm mist humidifier or inhale steam from a shower to increase air moisture. This may make it easier to breathe. °· Drink enough fluid to keep your pee (urine) clear or pale yellow. °· Eat soups and other clear broths. °· Have a healthy diet. °· Rest as needed. °· Go back to work when your fever is gone or your doctor says it is okay. °¨ You may need to stay home longer to avoid giving your URI to others. °¨ You can also wear a face mask and wash your hands often to prevent spread of the virus. °· Use your inhaler more if you have asthma. °· Do not use any tobacco products, including cigarettes, chewing tobacco, or electronic cigarettes. If you need help quitting, ask your doctor. °GET HELP IF: °· You are getting worse, not better. °· Your symptoms are not helped by medicine. °· You have chills. °· You are getting more short of breath. °· You have brown or red mucus. °· You have yellow or brown discharge from your nose. °· You have pain in your face, especially when you bend forward. °· You have a fever. °· You have puffy (swollen) neck glands. °· You have pain while swallowing. °· You have white areas in the back of your throat. °GET HELP RIGHT AWAY IF:  °· You have very bad or constant: °· Headache. °· Ear pain. °· Pain in your forehead, behind your eyes, and over your cheekbones (sinus pain). °· Chest pain. °· You have long-lasting (chronic) lung disease and any of the following: °· Wheezing. °· Long-lasting cough. °· Coughing up blood. °· A change in your usual mucus. °· You have a stiff neck. °· You have changes in  your: °· Vision. °· Hearing. °· Thinking. °· Mood. °MAKE SURE YOU:  °· Understand these instructions. °· Will watch your condition. °· Will get help right away if you are not doing well or get worse. °  °This information is not intended to replace advice given to you by your health care provider. Make sure you discuss any questions you have with your health care provider. °  °Document Released: 03/29/2008 Document Revised: 02/25/2015 Document Reviewed: 01/16/2014 °Elsevier Interactive Patient Education ©2016 Elsevier Inc[employer] ° °How to Use an Inhaler °Using your inhaler correctly is very important. Good technique will make sure that the medicine reaches your lungs.  °HOW TO USE AN INHALER: °· Take the cap off the inhaler. °· If this is the first time using your inhaler, you need to prime it. Shake the inhaler for 5 seconds. Release four puffs into the air, away from your face. Ask your doctor for help if you have questions. °· Shake the inhaler for 5 seconds. °· Turn the inhaler so the bottle is above the mouthpiece. °· Put your pointer finger on top of the bottle. Your thumb holds the bottom of the inhaler. °· Open your mouth. °· Either hold the inhaler away from your mouth (the width of 2 fingers) or place your lips tightly around the mouthpiece. Ask your doctor which way to use your inhaler. °· Breathe out as   much air as possible. °· Breathe in and push down on the bottle 1 time to release the medicine. You will feel the medicine go in your mouth and throat. °· Continue to take a deep breath in very slowly. Try to fill your lungs. °· After you have breathed in completely, hold your breath for 10 seconds. This will help the medicine to settle in your lungs. If you cannot hold your breath for 10 seconds, hold it for as long as you can before you breathe out. °· Breathe out slowly, through pursed lips. Whistling is an example of pursed lips. °· If your doctor has told you to take more than 1 puff, wait at least 15-30  seconds between puffs. This will help you get the best results from your medicine. Do not use the inhaler more than your doctor tells you to. °· Put the cap back on the inhaler. °· Follow the directions from your doctor or from the inhaler package about cleaning the inhaler. °If you use more than one inhaler, ask your doctor which inhalers to use and what order to use them in. Ask your doctor to help you figure out when you will need to refill your inhaler.  °If you use a steroid inhaler, always rinse your mouth with water after your last puff, gargle and spit out the water. Do not swallow the water. °GET HELP IF: °· The inhaler medicine only partially helps to stop wheezing or shortness of breath. °· You are having trouble using your inhaler. °· You have some increase in thick spit (phlegm). °GET HELP RIGHT AWAY IF: °· The inhaler medicine does not help your wheezing or shortness of breath or you have tightness in your chest. °· You have dizziness, headaches, or fast heart rate. °· You have chills, fever, or night sweats. °· You have a large increase of thick spit, or your thick spit is bloody. °MAKE SURE YOU:  °· Understand these instructions. °· Will watch your condition. °· Will get help right away if you are not doing well or get worse. °  °This information is not intended to replace advice given to you by your health care provider. Make sure you discuss any questions you have with your health care provider. °  °Document Released: 07/20/2008 Document Revised: 08/01/2013 Document Reviewed: 05/10/2013 °Elsevier Interactive Patient Education ©2016 [employer] °Acute Bronchitis °Bronchitis is when the airways that extend from the windpipe into the lungs get red, puffy, and painful (inflamed). Bronchitis often causes thick spit (mucus) to develop. This leads to a cough. A cough is the most common symptom of bronchitis. °In acute bronchitis, the condition usually begins suddenly and goes away over time (usually in  2 weeks). Smoking, allergies, and asthma can make bronchitis worse. Repeated episodes of bronchitis may cause more lung problems. °HOME CARE °· Rest. °· Drink enough fluids to keep your pee (urine) clear or pale yellow (unless you need to limit fluids as told by your doctor). °· Only take over-the-counter or prescription medicines as told by your doctor. °· Avoid smoking and secondhand smoke. These can make bronchitis worse. If you are a smoker, think about using nicotine gum or skin patches. Quitting smoking will help your lungs heal faster. °· Reduce the chance of getting bronchitis again by: °¨ Washing your hands often. °¨ Avoiding people with cold symptoms. °¨ Trying not to touch your hands to your mouth, nose, or eyes. °· Follow up with your doctor as told. °GET HELP IF: °Your symptoms   do not improve after 1 week of treatment. Symptoms include: °· Cough. °· Fever. °· Coughing up thick spit. °· Body aches. °· Chest congestion. °· Chills. °· Shortness of breath. °· Sore throat. °GET HELP RIGHT AWAY IF:  °· You have an increased fever. °· You have chills. °· You have severe shortness of breath. °· You have bloody thick spit (sputum). °· You throw up (vomit) often. °· You lose too much body fluid (dehydration). °· You have a severe headache. °· You faint. °MAKE SURE YOU:  °· Understand these instructions. °· Will watch your condition. °· Will get help right away if you are not doing well or get worse. °  °This information is not intended to replace advice given to you by your health care provider. Make sure you discuss any questions you have with your health care provider. °  °Document Released: 03/29/2008 Document Revised: 06/13/2013 Document Reviewed: 04/03/2013 °Elsevier Interactive Patient Education ©2016 Elsevier Inc. ° °

## 2016-03-05 NOTE — ED Provider Notes (Signed)
CSN: [id number]     Arrival date & time 03/05/16  1119 History   First MD Initiated Contact with Patient 03/05/16 1158     Chief Complaint  Patient presents with  . Cough  . Sore Throat  . Nasal Congestion   (Consider location/radiation/quality/duration/timing/severity/associated sxs/prior Treatment) HPI History obtained from patient:  Pt presents with the cc of: cough, wheeze Duration of symptoms: almost 1 week Treatment prior to arrival: OTC meds without much relief Context:sudden onset  Other symptoms include: some sputum, no fever. Pain score:2 FAMILY HISTORY: DM mother SOCIAL HISTORY:smoker  Past Medical History  Diagnosis Date  . Pericarditis 11/2013  . 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 Asthma    Past Surgical History  Procedure Laterality Date  . Pericardial window N/A 12/11/2013    Procedure: PERICARDIAL WINDOW;  Surgeon: Delight Ovens, MD;  Location: [facility] OR;  Service: Thoracic;  Laterality: N/A;  W/ TEE  . Intraoperative transesophageal echocardiogram N/A 12/11/2013    Procedure: INTRAOPERATIVE TRANSESOPHAGEAL ECHOCARDIOGRAM;  Surgeon: Delight Ovens, MD;  Location: [facility] OR;  Service: Open Heart Surgery;  Laterality: N/A;   Family History  Problem Relation Age of Onset  . Diabetes Mother   . Healthy Sister   . Heart disease Maternal Grandmother   . Diabetes Maternal Grandmother    Social History  Substance Use Topics  . Smoking status: Current Every Day Smoker -- 1.00 packs/day    Types: Cigarettes  . Smokeless tobacco: None  . Alcohol Use: Yes     Comment: socially    Review of Systems ROS +'ve cough, wheeze  Denies: HEADACHE, NAUSEA, ABDOMINAL PAIN, CHEST PAIN, CONGESTION, DYSURIA, SHORTNESS OF BREATH  Allergies  Amoxicillin; Penicillins; and Colchicine  Home Medications   Prior to Admission medications   Medication Sig Start Date End Date Taking? Authorizing Provider  albuterol (PROVENTIL HFA;VENTOLIN HFA) 108 (90 Base) MCG/ACT inhaler Inhale 2  puffs into the lungs every 4 (four) hours as needed for wheezing or shortness of breath. 03/05/16   Tharon Aquas, PA  azithromycin (ZITHROMAX Z-PAK) 250 MG tablet Take 2 tablets (500 mg) on  Day 1,  followed by 1 tablet (250 mg) once daily on Days 2 through 5. 09/01/15   Renford Dills, NP  azithromycin (ZITHROMAX) 250 MG tablet Take 1 tablet (250 mg total) by mouth daily. Take first 2 tablets together, then 1 every day until finished. 03/05/16   Tharon Aquas, PA  benzonatate (TESSALON) 100 MG capsule Take 1 capsule (100 mg total) by mouth every 8 (eight) hours. 03/05/16   Tharon Aquas, PA  benzonatate (TESSALON) 200 MG capsule Take one cap TID PRN cough 01/12/16   Lutricia Feil, PA-C  carvedilol (COREG) 6.25 MG tablet Take 1 tablet (6.25 mg total) by mouth 2 (two) times daily. 02/17/15   Mihai Croitoru, MD  chlorpheniramine-HYDROcodone (TUSSIONEX PENNKINETIC ER) 10-8 MG/5ML SUER Take 5 mLs by mouth 2 (two) times daily. 01/12/16   Lutricia Feil, PA-C  fluticasone (FLONASE) 50 MCG/ACT nasal spray Place 2 sprays into both nostrils daily. 01/12/16   Lutricia Feil, PA-C  ibuprofen (ADVIL,MOTRIN) 600 MG tablet Take 1 tablet (600 mg total) by mouth 3 (three) times daily. Patient taking differently: Take 600 mg by mouth every 8 (eight) hours as needed.  12/14/13   Lonia Blood, MD  lisinopril (PRINIVIL,ZESTRIL) 20 MG tablet Take 1 tablet (20 mg total) by mouth 2 (two) times daily. 02/17/15   Thurmon Fair, MD   Meds Ordered and  Administered this Visit  Medications - No data to display  BP 100/60 mmHg  Pulse 73  Temp(Src) 98.2 F (36.8 C) (Tympanic)  Resp 16  Ht  (1.778 m)  Wt 295 lb (133.811 kg)  BMI 42.33 kg/m2  SpO2 99% No data found.   Physical Exam NURSES NOTES AND VITAL SIGNS REVIEWED. CONSTITUTIONAL: Well developed, well nourished, no acute distress HEENT: normocephalic, atraumatic EYES: Conjunctiva normal NECK:normal ROM, supple, no adenopathy PULMONARY:No  respiratory distress, normal effort, diffuse wheezes ABDOMINAL: Soft, ND, NT BS+, No CVAT MUSCULOSKELETAL: Normal ROM of all extremities,  SKIN: warm and dry without rash PSYCHIATRIC: Mood and affect, behavior are normal   ED Course  Procedures (including critical care time)  Labs Review Labs Reviewed - No data to display  Imaging Review No results found.   Visual Acuity Review  Right Eye Distance:   Left Eye Distance:   Bilateral Distance:    Right Eye Near:   Left Eye Near:    Bilateral Near:      rx for zpak, tessalon, albuterol   MDM   1. Bronchitis   new  Patient is reassured that there are no issues that require transfer to higher level of care at this time or additional tests. Patient is advised to continue home symptomatic treatment. Patient is advised that if there are new or worsening symptoms to attend the emergency department, contact primary care provider, or return to UC. Instructions of care provided discharged home in stable condition.    THIS NOTE WAS GENERATED USING A VOICE RECOGNITION SOFTWARE PROGRAM. ALL REASONABLE EFFORTS  WERE MADE TO PROOFREAD THIS DOCUMENT FOR ACCURACY.  I have verbally reviewed the discharge instructions with the patient. A printed AVS was given to the patient.  All questions were answered prior to discharge.      Tharon Aquas, PA 03/05/16 1210

## 2016-03-05 NOTE — ED Notes (Signed)
Patient c/o cough, runny nose, sore throat for the past 2 days.

## 2016-04-05 ENCOUNTER — Other Ambulatory Visit: Payer: Self-pay | Admitting: Cardiovascular Disease

## 2016-06-15 ENCOUNTER — Other Ambulatory Visit: Payer: Self-pay | Admitting: Cardiovascular Disease

## 2016-06-15 DIAGNOSIS — I429 Cardiomyopathy, unspecified: Secondary | ICD-10-CM

## 2016-06-15 NOTE — Telephone Encounter (Signed)
Rx(s) sent to pharmacy electronically. Confirmed dose w/patient

## 2016-09-04 ENCOUNTER — Other Ambulatory Visit: Payer: Self-pay | Admitting: Cardiovascular Disease

## 2016-09-04 DIAGNOSIS — I429 Cardiomyopathy, unspecified: Secondary | ICD-10-CM

## 2016-09-06 ENCOUNTER — Other Ambulatory Visit: Payer: Self-pay

## 2016-09-06 MED ORDER — CARVEDILOL 25 MG PO TABS: 25.0000 mg | ORAL_TABLET | Freq: Two times a day (BID) | ORAL | 0 refills | Status: DC

## 2016-10-08 ENCOUNTER — Telehealth: Payer: Self-pay | Admitting: Cardiovascular Disease

## 2016-10-08 DIAGNOSIS — I420 Dilated cardiomyopathy: Secondary | ICD-10-CM

## 2016-10-08 NOTE — Telephone Encounter (Signed)
New message  Pt is calling to reschedule the echo that was in 2016, states he didn't have it done  Please advise if this is still necessary  Will need a new order  Please call pt and advise

## 2016-10-08 NOTE — Telephone Encounter (Addendum)
Called patient-no answer, left detailed message on home answering machine (ok per DPR).  Advised that follow up echo has been ordered per MD Croitoru and someone will be contacting him to get test scheduled.  Advised to call with further questions/concerns.  Order placed, message sent to admin to schedule.

## 2016-10-08 NOTE — Telephone Encounter (Signed)
Returned call to patient-pt states MD Croitoru wanted to have a follow up echo completed after his OV on 07/2015 and never had it completed.    Last echo 06/2015.    Advised I would route to MD to see if f/u echo is needed.

## 2016-10-08 NOTE — Telephone Encounter (Signed)
Yes, please order echo

## 2017-01-03 ENCOUNTER — Other Ambulatory Visit (HOSPITAL_COMMUNITY): Payer: BLUE CROSS/BLUE SHIELD

## 2017-01-17 ENCOUNTER — Other Ambulatory Visit: Payer: Self-pay

## 2017-01-17 ENCOUNTER — Ambulatory Visit (HOSPITAL_COMMUNITY): Payer: BLUE CROSS/BLUE SHIELD | Attending: Cardiology

## 2017-01-17 DIAGNOSIS — I420 Dilated cardiomyopathy: Secondary | ICD-10-CM | POA: Insufficient documentation

## 2017-01-17 DIAGNOSIS — I340 Nonrheumatic mitral (valve) insufficiency: Secondary | ICD-10-CM | POA: Insufficient documentation

## 2017-01-19 ENCOUNTER — Telehealth: Payer: Self-pay

## 2017-01-19 MED ORDER — LISINOPRIL 20 MG PO TABS: 20.0000 mg | ORAL_TABLET | Freq: Two times a day (BID) | ORAL | 1 refills | Status: DC

## 2017-01-19 MED ORDER — CARVEDILOL 25 MG PO TABS: 25.0000 mg | ORAL_TABLET | Freq: Two times a day (BID) | ORAL | 1 refills | Status: DC

## 2017-01-19 NOTE — Telephone Encounter (Signed)
Patient returned call. Results reviewed. Patient verbalized understanding. Pt overdue for follow-up. Appt made for 03/18/17 at 9:40a. Pt needs refills of his blood pressure medicines. States he ran out to refills 2 months ago. No attempts made to contact office for refills.  Notified pt that I would refill his medication and we will see him at appt date/time to discuss his BP medications. Pt verbalized understanding and agreed with plan. Medications ordered and sent to pt's pharmacy electronically.

## 2017-01-19 NOTE — Telephone Encounter (Signed)
lmtcb

## 2017-01-19 NOTE — Telephone Encounter (Signed)
-----   Message from Thurmon Fair, MD sent at 01/18/2017  9:01 AM EDT ----- Normal echo - no pericardial fluid and normal heart pumping strength: excellent news.

## 2017-03-18 ENCOUNTER — Ambulatory Visit: Payer: BLUE CROSS/BLUE SHIELD | Admitting: Cardiovascular Disease

## 2017-04-04 ENCOUNTER — Encounter: Payer: Self-pay | Admitting: Physician Assistant

## 2017-04-04 ENCOUNTER — Ambulatory Visit (INDEPENDENT_AMBULATORY_CARE_PROVIDER_SITE_OTHER): Payer: BLUE CROSS/BLUE SHIELD | Admitting: Physician Assistant

## 2017-04-04 VITALS — BP 134/86 | HR 67 | Ht 70.0 in | Wt 289.0 lb

## 2017-04-04 DIAGNOSIS — Z79899 Other long term (current) drug therapy: Secondary | ICD-10-CM | POA: Diagnosis not present

## 2017-04-04 DIAGNOSIS — I420 Dilated cardiomyopathy: Secondary | ICD-10-CM | POA: Diagnosis not present

## 2017-04-04 MED ORDER — CARVEDILOL 12.5 MG PO TABS: 12.5000 mg | ORAL_TABLET | Freq: Two times a day (BID) | ORAL | 11 refills | Status: DC

## 2017-04-04 MED ORDER — LISINOPRIL 20 MG PO TABS: 20.0000 mg | ORAL_TABLET | Freq: Every day | ORAL | 11 refills | Status: DC

## 2017-04-04 NOTE — Progress Notes (Signed)
Cardiology Office Note   Date:  04/04/2017   ID:  Andrew Harmon, DOB 1986/01/24, MRN [id number]  PCP:  Dione Housekeeper, MD  Cardiologist:  Dr Royann Shivers 10/24/016  Theodore Demark, PA-C   Chief Complaint  Patient presents with  . Appointment    has had some exposure to mold. currently on Prednisone. No CP. has had some SHOB, Dizziness. No swelling. No nausea or vomitting.     History of Present Illness: Andrew Harmon is a 31 y.o. 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  Andrew Harmon presents for follow up  He has not had problems with SOB. He had some exposure to black mold>>caused fatigue and some SOB. Was started on prednisone taper and inhaler. He had some mild dizziness while standing on a ladder 2 days ago, has not been in danger of falling or passing out.   No chest pain, no LE edema, no orthopnea, no PND.  He has been completely out of his cardiac meds x 3 weeks. He still smokes. He admits that he does not eat a healthy diet.   Past Medical History:  Diagnosis Date  . Asthma   . 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 OSA on CPAP   . Pericarditis 11/2013    Past Surgical History:  Procedure Laterality Date  . INTRAOPERATIVE TRANSESOPHAGEAL ECHOCARDIOGRAM N/A 12/11/2013   Procedure: INTRAOPERATIVE TRANSESOPHAGEAL ECHOCARDIOGRAM;  Surgeon: Delight Ovens, MD;  Location: [facility] OR;  Service: Open Heart Surgery;  Laterality: N/A;  . PERICARDIAL WINDOW N/A 12/11/2013   Procedure: PERICARDIAL WINDOW;  Surgeon: Delight Ovens, MD;  Location: [facility] OR;  Service: Thoracic;  Laterality: N/A;  W/ TEE    Current Outpatient Prescriptions - only on prednisone taper and nasal spray  Medication Sig Dispense Refill  . carvedilol (COREG) 25 MG tablet Take 1 tablet (25 mg total) by mouth 2 (two) times daily with a meal. MUST KEEP APPOINTMENT 03/18/17 WITH DR  Royann Shivers FOR FUTURE REFILLS 60 tablet 1  . fluticasone (FLONASE) 50 MCG/ACT nasal spray Place 2 sprays into both nostrils daily. 16 g 0  . ibuprofen (ADVIL,MOTRIN) 600 MG tablet Take 1 tablet (600 mg total) by mouth 3 (three) times daily. (Patient taking differently: Take 600 mg by mouth every 8 (eight) hours as needed. ) 42 tablet 0  . ipratropium (ATROVENT) 0.06 % nasal spray Place 2 sprays into both nostrils 3 (three) times daily. For 14 days    . lisinopril (PRINIVIL,ZESTRIL) 20 MG tablet Take 1 tablet (20 mg total) by mouth 2 (two) times daily. MUST KEEP APPOINTMENT 03/18/17 WITH DR CROITORU FOR FUTURE REFILLS 60 tablet 1  . predniSONE (DELTASONE) 20 MG tablet Take by mouth. Taper dose for 7 days     No current facility-administered medications for this visit.     Allergies:   Amoxicillin; Penicillins; and Colchicine    Social History:  The patient  reports that he has been smoking Cigarettes.  He has been smoking about 1.00 pack per day. He has never used smokeless tobacco. He reports that he drinks alcohol. He reports that he does not use drugs.   Family History:  The patient's family history includes Diabetes in his maternal grandmother and mother; Healthy in his sister; Heart disease in his maternal grandmother.    ROS:  Please see the history of present illness. All other systems are reviewed and negative.  PHYSICAL EXAM: VS:  BP 134/86 (BP Location: Right Arm, Patient Position: Sitting, Cuff Size: Large)   Pulse 67   Ht 5\' 10"  (1.778 m)   Wt 289 lb (131.1 kg)   BMI 41.47 kg/m  , BMI Body mass index is 41.47 kg/m. GEN: Well nourished, well developed, male in no acute distress  HEENT: normal for age  Neck: no JVD, no carotid bruit, no masses Cardiac: RRR; no murmur, no rubs, or gallops Respiratory:  clear to auscultation bilaterally, normal work of breathing GI: soft, nontender, nondistended, + BS MS: no deformity or atrophy; no edema; distal pulses are 2+ in all 4  extremities   Skin: warm and dry, no rash Neuro:  Strength and sensation are intact Psych: euthymic mood, full affect   EKG:  EKG is ordered today. The ekg ordered today demonstrates SR, no acute changes  ECHO: 01/17/2017 - Left ventricle: The cavity size was normal. Wall thickness was   normal. Systolic function was normal. The estimated ejection   fraction was in the range of 55% to 60%. Wall motion was normal;   there were no regional wall motion abnormalities. Left   ventricular diastolic function parameters were normal. - Aortic valve: There was no stenosis. - Mitral valve: There was trivial regurgitation. - Right ventricle: The cavity size was normal. Systolic function   was normal. - Pulmonary arteries: No complete TR doppler jet so unable to   estimate PA systolic pressure. - Inferior vena cava: The vessel was normal in size. The   respirophasic diameter changes were in the normal range (>= 50%),   consistent with normal central venous pressure. Impressions: - Normal study.  Recent Labs: No results found for requested labs within last 8760 hours.    Lipid Panel No results found for: CHOL, TRIG, HDL, CHOLHDL, VLDL, LDLCALC, LDLDIRECT   Wt Readings from Last 3 Encounters:  04/04/17 289 lb (131.1 kg)  03/05/16 295 lb (133.8 kg)  01/12/16 285 lb (129.3 kg)     Other studies Reviewed: Additional studies/ records that were reviewed today include: Office notes, hospital records and testing.  ASSESSMENT AND PLAN:  1.  Cardiomyopathy: His EF was normal by recent echo, but that was on medication. He has not had blood pressure lowering medications in several weeks and his pressure is mildly above target. I will restart his beta blocker and ACE inhibitor at half the previous doses. He is to get a BMET in 2 weeks. He prefers to get this done at the urgent care in [facility], more convenient for him. We will see him again in a year, sooner if needed.  In his last note, Dr.  Royann Shivers discussed tapering and possibly discontinuing his medications. With his blood pressure above target, I will keep him on a lower dose of the ACE inhibitor and beta blocker for the next year.  2. Borderline HTN: Restart ACE/BB, think lower doses will control BP. With his history of cardiomyopathy, cold blood pressure is 130/80.   Current medicines are reviewed at length with the patient today.  The patient has concerns regarding medicines. Concerns were addressed  The following changes have been made:  Restart Ace and beta blocker at decreased dose  Labs/ tests ordered today include:   Orders Placed This Encounter  Procedures  . Basic metabolic panel  . EKG 12-Lead     Disposition:   FU with Dr. Royann Shivers  Signed, Leanna Battles  04/04/2017 8:55 AM    [facility] Phone: [phone]; Fax: [phone]  This note was written with the assistance of speech recognition software. Please excuse any transcriptional errors.

## 2017-04-04 NOTE — Patient Instructions (Addendum)
Your physician has recommended you make the following change in your medication:  -- decrease carvedilol to 12.5mg  twice daily -- decrease lisinopril to 20mg  once daily  Your physician recommends that you return for lab work (BMET) in a few weeks on Monday June 25  Your physician wants you to follow-up in: ONE YEAR with Dr. Royann Shivers. You will receive a reminder letter in the mail two months in advance. If you don't receive a letter, please call our office to schedule the follow-up appointment.

## 2017-05-09 IMAGING — MR MR LUMBAR SPINE WO/W CM
6 of 7 series · 37 of 48 positions shown · IV contrast (multihance)
Comparison: None.

CLINICAL DATA: 29-year-old male with palpable abnormality left
lumbar region. Non painful. Subcutaneous mass. Initial encounter.

EXAM:
MRI LUMBAR SPINE WITHOUT AND WITH CONTRAST
TECHNIQUE: Multiplanar and multiecho pulse sequences of the lumbar spine were
obtained without and with intravenous contrast.
CONTRAST:  20mL MULTIHANCE GADOBENATE DIMEGLUMINE 529 MG/ML IV SOLN

[Series 4: T2 · sagittal · 4.0mm · 0.81mm/px · 6 of 25 slices shown (1 of 2)]
[im 1/25]
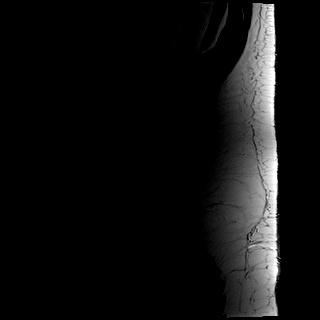
[im 5/25]
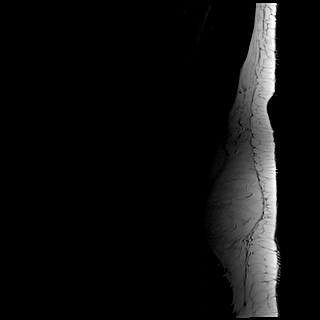
[im 10/25]
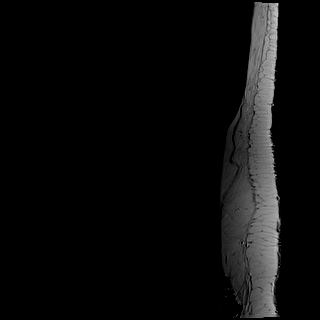
[im 15/25]
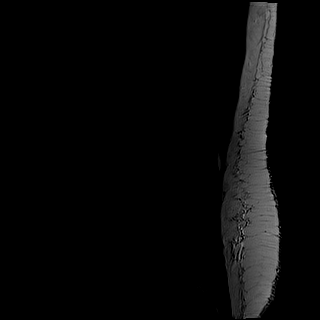
[im 20/25]
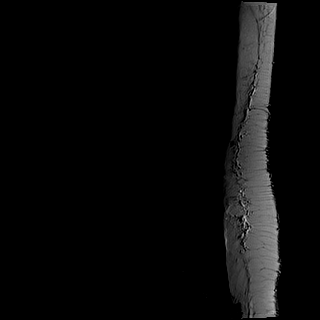
[im 25/25]
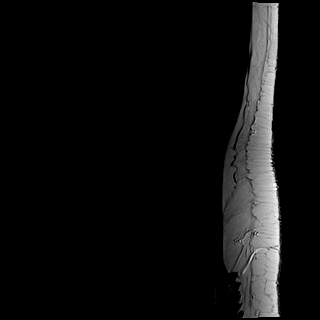

[Series 5: STIR · sagittal · 4.0mm · 1.02mm/px · 3 of 25 slices shown]
[im 1/25]
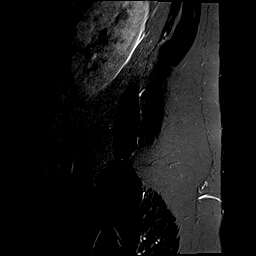
[im 5/25]
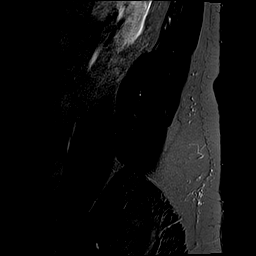
[im 10/25]
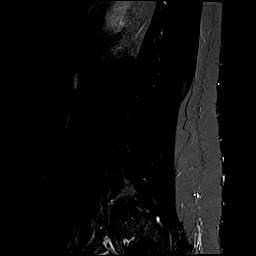

[Series 6: T1 · sagittal · 4.0mm · 0.81mm/px · 6 of 25 slices shown (1 of 2)]
[im 1/25]
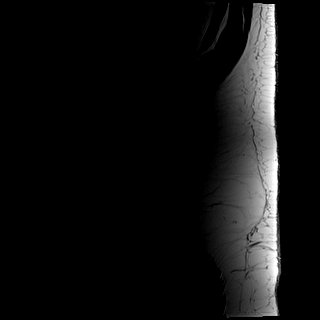
[im 5/25]
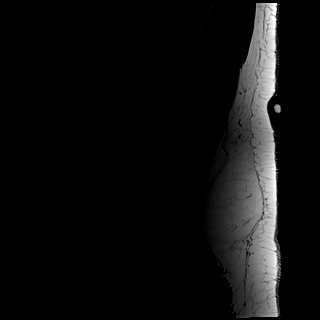
[im 10/25]
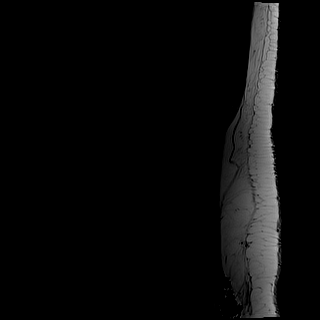
[im 15/25]
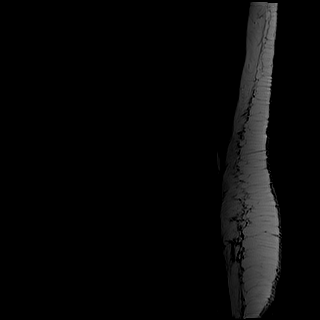
[im 20/25]
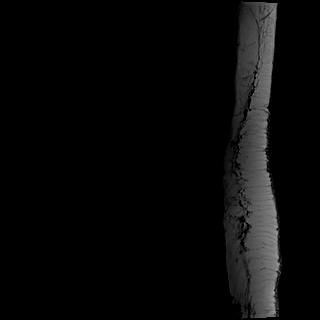
[im 25/25]
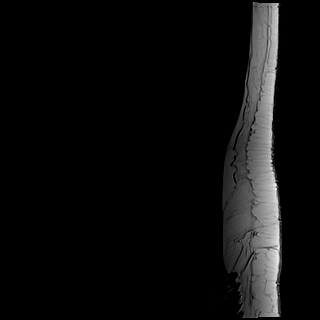

[Series 7: T2 · axial · 4.0mm · 0.78mm/px · z∈[-89,+102]mm · 8 of 36 slices shown (2 of 2)]
[im 1/36]
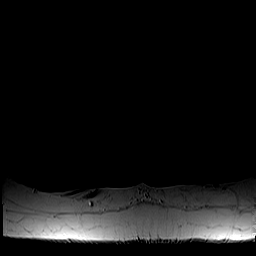
[im 6/36]
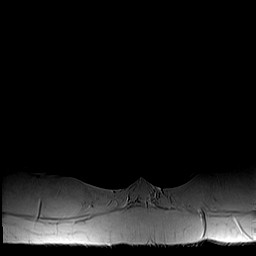
[im 11/36]
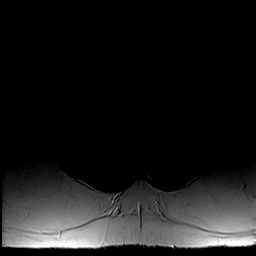
[im 16/36]
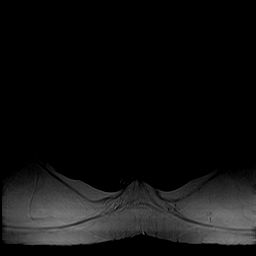
[im 21/36]
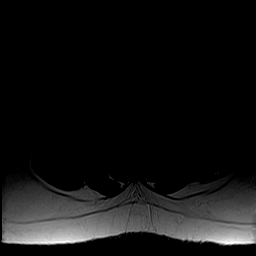
[im 26/36]
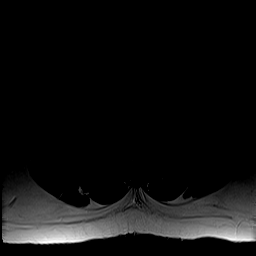
[im 31/36]
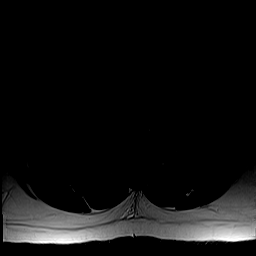
[im 36/36]
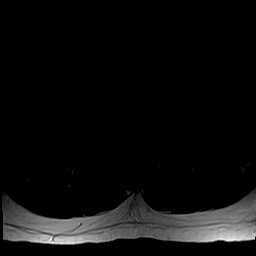

[Series 8: T1 · axial · 4.0mm · 0.39mm/px · z∈[-89,+102]mm · 8 of 36 slices shown (2 of 2)]
[im 1/36]
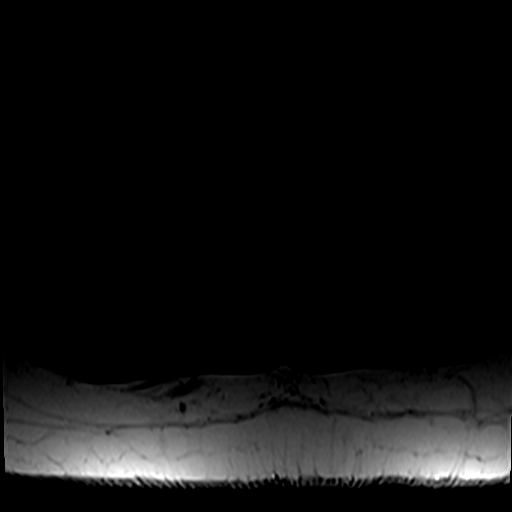
[im 6/36]
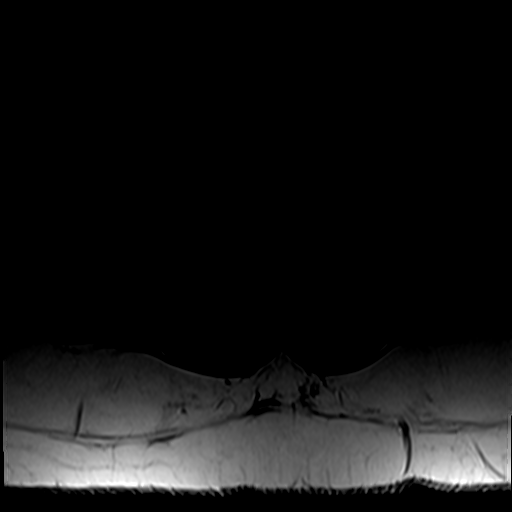
[im 11/36]
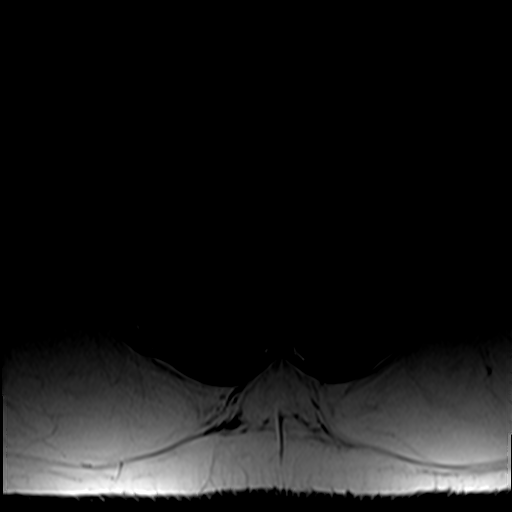
[im 16/36]
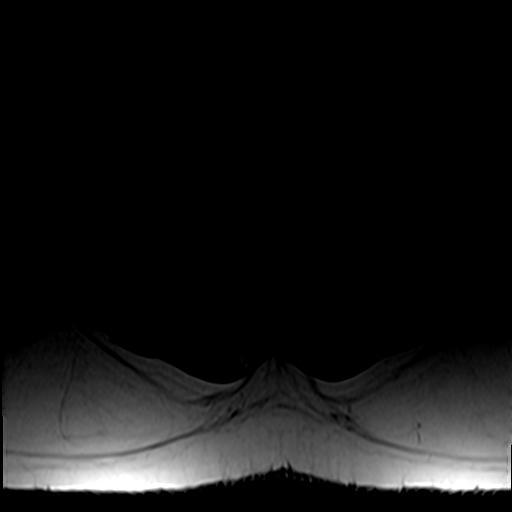
[im 21/36]
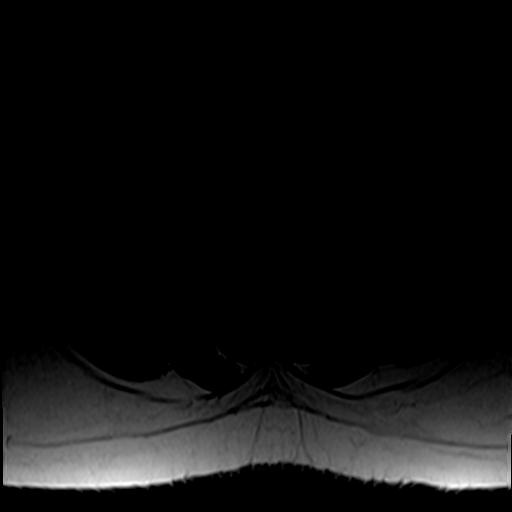
[im 26/36]
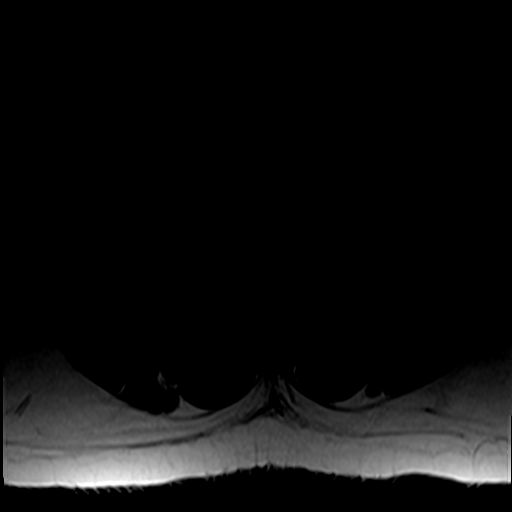
[im 31/36]
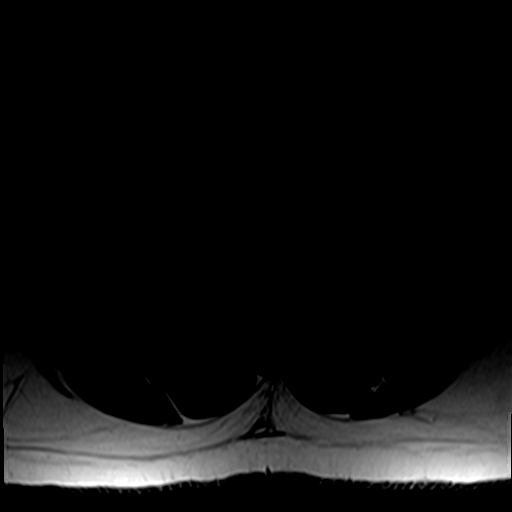
[im 36/36]
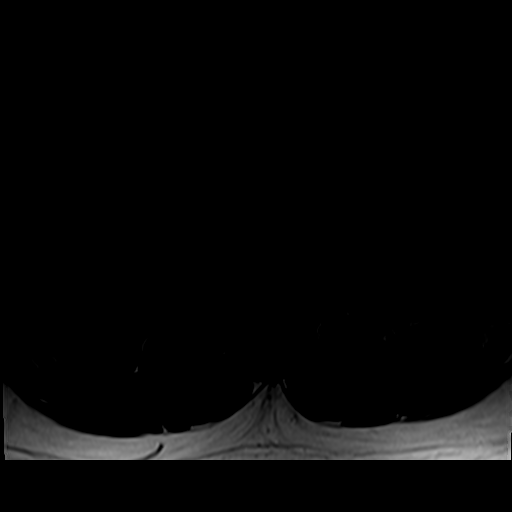

[Series 9: T1 fat-sat post-contrast · sagittal · 4.0mm · 0.81mm/px · 6 of 25 slices shown]
[im 1/25]
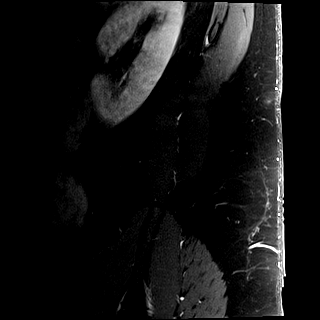
[im 5/25]
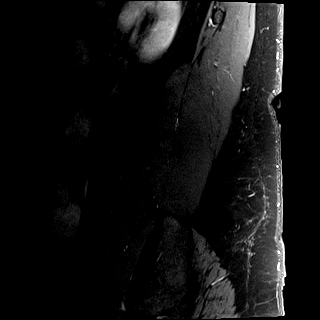
[im 10/25]
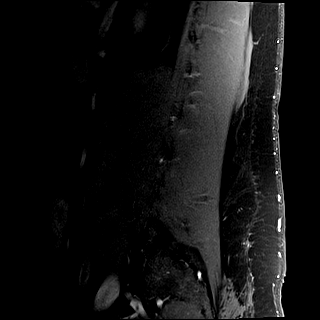
[im 15/25]
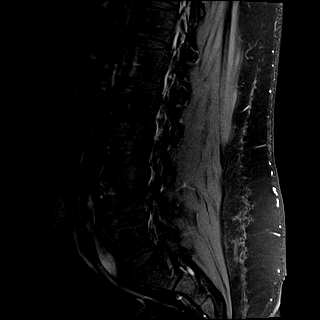
[im 20/25]
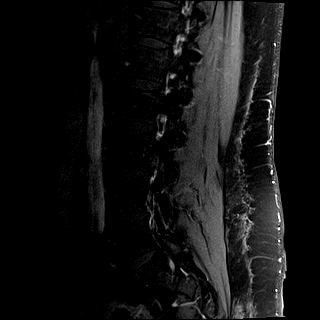
[im 25/25]
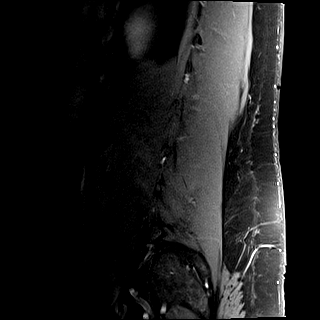

[37 of 48 positions shown; findings below may reference images not displayed]

FINDINGS: A skin marker was placed at the palpable area of concern and is seen
on series 6, image 21. Underlying the marker there is normal
appearing subcutaneous fat. Large body habitus is noted. No soft
tissue mass or inflammation in the region. No fluid collection.
Regional paraspinal muscles appear normal. No encapsulated fat in
the area of clinical concern to suggest lipoma.
Just lateral to the skin marker, evident only on series 9 image 24,
there is a small subcutaneous area of diminutive vessels which is
mildly conspicuous. No associated inflammation or vascular flow
voids.

Lumbar segmentation appears to be normal and will be designated as
such for this report. No marrow edema or evidence of acute osseous
abnormality. Visualized lower thoracic spinal cord is normal with
conus medularis at T12-L1. No abnormal enhancement identified.

Negative visualized abdominal viscera.

Normal lower thoracic and lumbar disc spaces except for L4-L5 were
there is mild disc desiccation and circumferential disc bulging.
Mild facet hypertrophy at that level. Epidural lipomatosis begins at
that level and continues throughout the sacrum.
IMPRESSION: 1. Marked area of concern along the left posterior flank with no
underlying soft tissue mass or abnormality. There is a nearby mildly
conspicuous area of tiny vessels which is most likely physiologic.
2. Mild lumbar disc and facet degeneration at L4-L5.

## 2017-05-27 ENCOUNTER — Ambulatory Visit: Payer: BLUE CROSS/BLUE SHIELD | Admitting: Cardiovascular Disease

## 2017-09-25 ENCOUNTER — Telehealth: Payer: Self-pay

## 2017-09-25 ENCOUNTER — Other Ambulatory Visit: Payer: Self-pay

## 2017-09-25 ENCOUNTER — Ambulatory Visit
Admission: EM | Admit: 2017-09-25 | Discharge: 2017-09-25 | Disposition: A | Discharge status: Home / Self Care | Payer: BLUE CROSS/BLUE SHIELD | Attending: Emergency Medicine | Admitting: Emergency Medicine

## 2017-09-25 DIAGNOSIS — R05 Cough: Secondary | ICD-10-CM

## 2017-09-25 DIAGNOSIS — J020 Streptococcal pharyngitis: Secondary | ICD-10-CM

## 2017-09-25 LAB — RAPID STREP SCREEN (MED CTR MEBANE ONLY): STREPTOCOCCUS, GROUP A SCREEN (DIRECT): POSITIVE — AB

## 2017-09-25 MED ORDER — AZITHROMYCIN 500 MG PO TABS: 500.0000 mg | ORAL_TABLET | Freq: Every day | ORAL | 0 refills | Status: DC

## 2017-09-25 MED ORDER — AZITHROMYCIN 500 MG PO TABS: 500.0000 mg | ORAL_TABLET | Freq: Every day | ORAL | 0 refills | Status: AC

## 2017-09-25 MED ORDER — DEXAMETHASONE SODIUM PHOSPHATE 10 MG/ML IJ SOLN
10.0000 mg | Freq: Once | INTRAMUSCULAR | Status: AC
  Administered 2017-09-25: 10 mg via INTRAMUSCULAR

## 2017-09-25 MED ORDER — IBUPROFEN 600 MG PO TABS
600.0000 mg | ORAL_TABLET | Freq: Four times a day (QID) | ORAL | 0 refills | Status: AC | PRN
Start: 2017-09-25 — End: ?

## 2017-09-25 MED ORDER — IBUPROFEN 600 MG PO TABS: 600.0000 mg | ORAL_TABLET | Freq: Four times a day (QID) | ORAL | 0 refills | Status: DC | PRN

## 2017-09-25 NOTE — Discharge Instructions (Signed)
1 gram of Tylenol and 600 mg ibuprofen together 3-4 times a day as needed for pain.  Make sure you drink plenty of extra fluids.  Some people find salt water gargles and  Traditional Medicinal's "Throat Coat" tea helpful. Take 5 mL of liquid Benadryl and 5 mL of Maalox. Mix it together, and then hold it in your mouth for as long as you can and then swallow. You may do this 4 times a day.   ° °Go to www.goodrx.com to look up your medications. This will give you a list of where you can find your prescriptions at the most affordable prices. Or ask the pharmacist what the cash price is, or if they have any other discount programs available to help make your medication more affordable. This can be less expensive than what you would pay with insurance.  ° °

## 2017-09-25 NOTE — ED Provider Notes (Signed)
HPI  SUBJECTIVE:  Patient reports sore throat starting 1 week ago. Sx worse with breathing through his mouth, smoking.  Sx better with nothing. Has tried to humidifying his CPAP and taking NyQuil w/ o relief.  No fever    + Cough/URI sxs with nasal congestion, rhinorrhea, postnasal drip + Fatigue + Bilateral ear itching and popping No Myalgias No Headache No Rash      + Recent Strep Exposure, recent mono exposure No Abdominal Pain + Occasional reflux sxs with belching and burning chest pain after eating acidic foods, but states that this is been bothering him for "a while". No Allergy sxs  No Breathing difficulty, voice changes No Drooling No Trismus No abx in past month.  + antipyretic in past 6-8 hours, took NyQuil pt is a smoker. Past medical history of pneumonia, OSA on CPAP, myocarditis, obesity.  No history of mono, recurrent strep, diabetes, hypertension, allergies or formal diagnosis of GERD PMD: Dione Housekeeperlmedo, Mario Ernesto, MD   Past Medical History:  Diagnosis Date  . Asthma   . 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 OSA on CPAP   . Pericarditis 11/2013    Past Surgical History:  Procedure Laterality Date  . INTRAOPERATIVE TRANSESOPHAGEAL ECHOCARDIOGRAM N/A 12/11/2013   Procedure: INTRAOPERATIVE TRANSESOPHAGEAL ECHOCARDIOGRAM;  Surgeon: Delight OvensEdward B Gerhardt, MD;  [facility];  Service: Open Heart Surgery;  Laterality: N/A;  . PERICARDIAL WINDOW N/A 12/11/2013   Procedure: PERICARDIAL WINDOW;  Surgeon: Delight OvensEdward B Gerhardt, MD;  [facility];  Service: Thoracic;  Laterality: N/A;  W/ TEE    Family History  Problem Relation Age of Onset  . Diabetes Mother   . Healthy Sister   . Heart disease Maternal Grandmother   . Diabetes Maternal Grandmother     Social History   Tobacco Use  . Smoking status: Current Every Day Smoker    Packs/day: 1.00    Types: Cigarettes  . Smokeless tobacco: Never Used  Substance Use Topics  . Alcohol use: Yes    Comment: socially  .  Drug use: No    No current facility-administered medications for this encounter.   Current Outpatient Medications:  .  carvedilol (COREG) 12.5 MG tablet, Take 1 tablet (12.5 mg total) by mouth 2 (two) times daily with a meal., Disp: 60 tablet, Rfl: 11 .  fluticasone (FLONASE) 50 MCG/ACT nasal spray, Place 2 sprays into both nostrils daily., Disp: 16 g, Rfl: 0 .  ipratropium (ATROVENT) 0.06 % nasal spray, Place 2 sprays into both nostrils 3 (three) times daily. For 14 days, Disp: , Rfl:  .  lisinopril (PRINIVIL,ZESTRIL) 20 MG tablet, Take 1 tablet (20 mg total) by mouth daily., Disp: 30 tablet, Rfl: 11 .  azithromycin (ZITHROMAX) 500 MG tablet, Take 1 tablet (500 mg total) by mouth daily for 5 days., Disp: 5 tablet, Rfl: 0 .  ibuprofen (ADVIL,MOTRIN) 600 MG tablet, Take 1 tablet (600 mg total) by mouth every 6 (six) hours as needed., Disp: 30 tablet, Rfl: 0  Allergies  Allergen Reactions  . Amoxicillin Hives  . Penicillins     Per his doctor  . Colchicine Rash     ROS  As noted in HPI.   Physical Exam  BP 123/72 (BP Location: Left Arm)   Pulse 94   Temp 98.6 F (37 C) (Oral)   Ht 5\' 10"  (1.778 m)   Wt (!) 309 lb (140.2 kg)   SpO2 97%   BMI 44.34 kg/m   Constitutional: Well developed, well nourished, no acute  distress Eyes:  EOMI, conjunctiva normal bilaterally HENT: Normocephalic, atraumatic,mucus membranes moist.  TMs normal bilaterally +  nasal congestion +  erythematous oropharynx + enlarged tonsils +  exudates. Uvula midline.  Respiratory: Normal inspiratory effort Cardiovascular: Normal rate, no murmurs, rubs, gallops GI: nondistended, nontender. No appreciable splenomegaly skin: No rash, skin intact Lymph:+ cervical LN  Musculoskeletal: no deformities Neurologic: Alert & oriented x 3, no focal neuro deficits Psychiatric: Speech and behavior appropriate.  ED Course   Medications  dexamethasone (DECADRON) injection 10 mg (10 mg Intramuscular Given 09/25/17  0845)    Orders Placed This Encounter  Procedures  . Rapid strep screen    Standing Status:   Standing    Number of Occurrences:   1    No results found for this or any previous visit (from the past 24 hour(s)). No results found.  ED Clinical Impression  Strep pharyngitis   ED Assessment/Plan  Rapid strep positive. Sending home with azithromycin for 5 days.  Patient reports hives and rash with penicillin.  Given dexamethasone 10 mg IM x1 for the tonsillar swelling.  Airway appears patent.  Home with ibuprofen, Tylenol. Patient to followup with PMD when necessary, will go to the ER if he gets worse in any way  Discussed labs,  MDM, plan and followup with patient. Discussed sn/sx that should prompt return to the ED. patient agrees with plan.   Meds ordered this encounter  Medications  . dexamethasone (DECADRON) injection 10 mg  . DISCONTD: ibuprofen (ADVIL,MOTRIN) 600 MG tablet    Sig: Take 1 tablet (600 mg total) by mouth every 6 (six) hours as needed.    Dispense:  30 tablet    Refill:  0  . DISCONTD: azithromycin (ZITHROMAX) 500 MG tablet    Sig: Take 1 tablet (500 mg total) by mouth daily for 5 days.    Dispense:  5 tablet    Refill:  0     *This clinic note was created using Scientist, clinical (histocompatibility and immunogenetics). Therefore, there may be occasional mistakes despite careful proofreading.    Domenick Gong, MD 09/26/17 [id number]

## 2017-09-25 NOTE — Telephone Encounter (Signed)
Patient called and states [facility] is having computer problems and he would like his prescriptions to be called in to [facility] instead. Sent prescriptions to [facility].

## 2017-09-25 NOTE — ED Triage Notes (Signed)
Patient c/o sore throat denies any other symptoms. Symptoms started 1 week ago.

## 2017-09-27 ENCOUNTER — Emergency Department
Admission: EM | Admit: 2017-09-27 | Discharge: 2017-09-27 | Disposition: A | Discharge status: Home / Self Care | Payer: BLUE CROSS/BLUE SHIELD | Attending: Student in an Organized Health Care Education/Training Program | Admitting: Student in an Organized Health Care Education/Training Program

## 2017-09-27 ENCOUNTER — Encounter: Payer: Self-pay | Admitting: Emergency Medicine

## 2017-09-27 ENCOUNTER — Emergency Department: Payer: BLUE CROSS/BLUE SHIELD

## 2017-09-27 ENCOUNTER — Other Ambulatory Visit: Payer: Self-pay

## 2017-09-27 DIAGNOSIS — J36 Peritonsillar abscess: Secondary | ICD-10-CM | POA: Diagnosis not present

## 2017-09-27 DIAGNOSIS — J029 Acute pharyngitis, unspecified: Secondary | ICD-10-CM | POA: Diagnosis present

## 2017-09-27 DIAGNOSIS — F17210 Nicotine dependence, cigarettes, uncomplicated: Secondary | ICD-10-CM | POA: Insufficient documentation

## 2017-09-27 DIAGNOSIS — J45909 Unspecified asthma, uncomplicated: Secondary | ICD-10-CM | POA: Insufficient documentation

## 2017-09-27 DIAGNOSIS — Z79899 Other long term (current) drug therapy: Secondary | ICD-10-CM | POA: Diagnosis not present

## 2017-09-27 LAB — MONONUCLEOSIS SCREEN: Mono Screen: NEGATIVE

## 2017-09-27 LAB — BASIC METABOLIC PANEL
Anion gap: 9 (ref 5–15)
BUN: 12 mg/dL (ref 6–20)
CALCIUM: 8.4 mg/dL — AB (ref 8.9–10.3)
CO2: 23 mmol/L (ref 22–32)
CREATININE: 0.82 mg/dL (ref 0.61–1.24)
Chloride: 106 mmol/L (ref 101–111)
GFR calc non Af Amer: >60 mL/min (ref >60)
GLUCOSE: 97 mg/dL (ref 65–99)
Potassium: 4.1 mmol/L (ref 3.5–5.1)
Sodium: 138 mmol/L (ref 135–145)

## 2017-09-27 MED ORDER — LIDOCAINE HCL (PF) 4 % IJ SOLN
5.0000 mL | Freq: Once | INTRAMUSCULAR | Status: AC
  Administered 2017-09-27: 5 mL via RESPIRATORY_TRACT
  Filled 2017-09-27: qty 5

## 2017-09-27 MED ORDER — IOPAMIDOL (ISOVUE-300) INJECTION 61%
75.0000 mL | Freq: Once | INTRAVENOUS | Status: AC | PRN
  Administered 2017-09-27: 75 mL via INTRAVENOUS
  Filled 2017-09-27: qty 75

## 2017-09-27 MED ORDER — LIDOCAINE-EPINEPHRINE (PF) 2 %-1:200000 IJ SOLN
10.0000 mL | Freq: Once | INTRAMUSCULAR | Status: DC
Start: 2017-09-27 — End: 2017-09-27

## 2017-09-27 MED ORDER — PREDNISONE 10 MG PO TABS: 10.0000 mg | ORAL_TABLET | Freq: Every day | ORAL | 0 refills | Status: DC

## 2017-09-27 MED ORDER — SODIUM CHLORIDE 0.9 % IV BOLUS (SEPSIS)
1000.0000 mL | Freq: Once | INTRAVENOUS | Status: AC
  Administered 2017-09-27: 1000 mL via INTRAVENOUS

## 2017-09-27 MED ORDER — MORPHINE SULFATE (PF) 4 MG/ML IV SOLN
4.0000 mg | INTRAVENOUS | Status: DC | PRN
  Administered 2017-09-27: 4 mg via INTRAVENOUS

## 2017-09-27 MED ORDER — CLINDAMYCIN PHOSPHATE 600 MG/50ML IV SOLN
600.0000 mg | Freq: Once | INTRAVENOUS | Status: AC
  Administered 2017-09-27: 600 mg via INTRAVENOUS
  Filled 2017-09-27 (×2): qty 50

## 2017-09-27 MED ORDER — LIDOCAINE-EPINEPHRINE (PF) 1 %-1:200000 IJ SOLN
30.0000 mL | Freq: Once | INTRAMUSCULAR | Status: AC
  Administered 2017-09-27: 30 mL via INTRADERMAL
  Filled 2017-09-27: qty 30

## 2017-09-27 MED ORDER — ONDANSETRON HCL 4 MG/2ML IJ SOLN
4.0000 mg | Freq: Once | INTRAMUSCULAR | Status: AC
  Administered 2017-09-27: 4 mg via INTRAVENOUS

## 2017-09-27 MED ORDER — HYDROCODONE-ACETAMINOPHEN 5-325 MG PO TABS: 1.0000 | ORAL_TABLET | ORAL | 0 refills | Status: DC | PRN

## 2017-09-27 MED ORDER — LIDOCAINE-EPINEPHRINE (PF) 1 %-1:200000 IJ SOLN
INTRAMUSCULAR | Status: AC
  Administered 2017-09-27: 30 mL via INTRADERMAL
  Filled 2017-09-27: qty 30

## 2017-09-27 MED ORDER — ONDANSETRON HCL 4 MG/2ML IJ SOLN
INTRAMUSCULAR | Status: AC
  Filled 2017-09-27: qty 2

## 2017-09-27 MED ORDER — CLINDAMYCIN HCL 300 MG PO CAPS: 600.0000 mg | ORAL_CAPSULE | Freq: Three times a day (TID) | ORAL | 0 refills | Status: AC

## 2017-09-27 MED ORDER — METHYLPREDNISOLONE ACETATE 40 MG/ML IJ SUSP
80.0000 mg | Freq: Once | INTRAMUSCULAR | Status: AC
  Administered 2017-09-27: 80 mg via INTRAMUSCULAR
  Filled 2017-09-27: qty 1

## 2017-09-27 MED ORDER — DEXAMETHASONE SODIUM PHOSPHATE 10 MG/ML IJ SOLN
10.0000 mg | Freq: Once | INTRAMUSCULAR | Status: AC
  Administered 2017-09-27: 10 mg via INTRAVENOUS

## 2017-09-27 MED ORDER — DEXTROSE 5 % IV SOLN
2.0000 g | Freq: Once | INTRAVENOUS | Status: AC
  Administered 2017-09-27: 2 g via INTRAVENOUS
  Filled 2017-09-27: qty 2

## 2017-09-27 MED ORDER — KETOROLAC TROMETHAMINE 30 MG/ML IJ SOLN
30.0000 mg | Freq: Once | INTRAMUSCULAR | Status: AC
  Administered 2017-09-27: 30 mg via INTRAVENOUS

## 2017-09-27 MED ORDER — MORPHINE SULFATE (PF) 4 MG/ML IV SOLN
INTRAVENOUS | Status: AC
  Filled 2017-09-27: qty 1

## 2017-09-27 NOTE — ED Notes (Signed)
Orders received verbally from Dr. Pershing Proud.

## 2017-09-27 NOTE — ED Provider Notes (Signed)
[facility] Emergency Department Provider Note    First MD Initiated Contact with Patient 09/27/17 1647     (approximate)  I have reviewed the triage vital signs and the nursing notes.   HISTORY  Chief Complaint Oral Swelling    HPI Andrew Harmon is a 31 y.o. male no recent hospitalizations but recent treatment for strep pharyngitis presents with 1 week of sore throat failing improvement of 3 days of oral azithromycin.  Patient states that he tested positive for strep over the weekend.  Was not tested for mono though he does have positive contacts for mono.  States he is having worsening pain on the right side of his neck.  States he is having trouble opening his mouth.  Has only taken Motrin for pain.  Did get a shot of Decadron with no significant improvement.  Describes the pain is itching and burning in sensation is mild to moderate in severity.  Past Medical History:  Diagnosis Date  . Asthma   . 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 OSA on CPAP   . Pericarditis 11/2013   Family History  Problem Relation Age of Onset  . Diabetes Mother   . Healthy Sister   . Heart disease Maternal Grandmother   . Diabetes Maternal Grandmother    Past Surgical History:  Procedure Laterality Date  . INTRAOPERATIVE TRANSESOPHAGEAL ECHOCARDIOGRAM N/A 12/11/2013   Procedure: INTRAOPERATIVE TRANSESOPHAGEAL ECHOCARDIOGRAM;  Surgeon: Delight OvensEdward B Gerhardt, MD;  [facility];  Service: Open Heart Surgery;  Laterality: N/A;  . PERICARDIAL WINDOW N/A 12/11/2013   Procedure: PERICARDIAL WINDOW;  Surgeon: Delight OvensEdward B Gerhardt, MD;  [facility];  Service: Thoracic;  Laterality: N/A;  W/ TEE   Patient Active Problem List   Diagnosis Date Noted  . Morbid obesity (HCC) 04/04/2017  . Acute myopericarditis 08/18/2015  . Dilated cardiomyopathy (HCC) 01/21/2015  . Acute pericarditis 12/12/2013  . Disease of pericardium 12/10/2013      Prior to Admission medications   Medication  Sig Start Date End Date Taking? Authorizing Provider  azithromycin (ZITHROMAX) 500 MG tablet Take 1 tablet (500 mg total) by mouth daily for 5 days. 09/25/17 09/30/17  Domenick GongMortenson, Ashley, MD  carvedilol (COREG) 12.5 MG tablet Take 1 tablet (12.5 mg total) by mouth 2 (two) times daily with a meal. 04/04/17   Barrett, Joline Salthonda G, PA-C  clindamycin (CLEOCIN) 300 MG capsule Take 2 capsules (600 mg total) by mouth 3 (three) times daily for 7 days. 09/27/17 10/04/17  Willy Eddyobinson, Charisse Wendell, MD  fluticasone (FLONASE) 50 MCG/ACT nasal spray Place 2 sprays into both nostrils daily. 01/12/16   Lutricia Feiloemer, William P, PA-C  HYDROcodone-acetaminophen (NORCO) 5-325 MG tablet Take 1 tablet by mouth every 4 (four) hours as needed for moderate pain. 09/27/17   Willy Eddyobinson, Willowdean Luhmann, MD  ibuprofen (ADVIL,MOTRIN) 600 MG tablet Take 1 tablet (600 mg total) by mouth every 6 (six) hours as needed. 09/25/17   Domenick GongMortenson, Ashley, MD  ipratropium (ATROVENT) 0.06 % nasal spray Place 2 sprays into both nostrils 3 (three) times daily. For 14 days 03/29/17 09/25/17  [provider]  lisinopril (PRINIVIL,ZESTRIL) 20 MG tablet Take 1 tablet (20 mg total) by mouth daily. 04/04/17   Barrett, Joline Salthonda G, PA-C  predniSONE (DELTASONE) 10 MG tablet Take 1 tablet (10 mg total) by mouth daily. Day 1-2: Take 50 mg  ( 5 pills) Day 3-4 : Take 40 mg (4pills) Day 5-6: Take 30 mg (3 pills) Day 7-8:  Take 20 mg (2 pills) Day  9:  Take 10mg  (1 pill) 09/27/17   Willy Eddy, MD  predniSONE (DELTASONE) 10 MG tablet Take 1 tablet (10 mg total) by mouth daily. Day 1-2: Take 50 mg  ( 5 pills) Day 3-4 : Take 40 mg (4pills) Day 5-6: Take 30 mg (3 pills) Day 7-8:  Take 20 mg (2 pills) Day 9:  Take 10mg  (1 pill) 09/27/17   Willy Eddy, MD    Allergies Amoxicillin; Penicillins; and Colchicine    Social History Social History   Tobacco Use  . Smoking status: Current Every Day Smoker    Packs/day: 1.00    Types: Cigarettes  . Smokeless tobacco: Never  Used  Substance Use Topics  . Alcohol use: Yes    Comment: socially  . Drug use: No    Review of Systems Patient denies headaches, rhinorrhea, blurry vision, numbness, shortness of breath, chest pain, edema, cough, abdominal pain, nausea, vomiting, diarrhea, dysuria, fevers, rashes or hallucinations unless otherwise stated above in HPI. ____________________________________________   PHYSICAL EXAM:  VITAL SIGNS: Vitals:   09/27/17 1536 09/27/17 1559  BP:  (!) 151/90  Pulse:  92  Resp: (P) 16 18  Temp: (P) 100 F (37.8 C)   SpO2:  100%    Constitutional: Alert and oriented. Well appearing and in no acute distress. Eyes: Conjunctivae are normal.  Head: Atraumatic. Nose: No congestion/rhinnorhea. Mouth/Throat: Mucous membranes are moist.  Patient does have bilateral exudates but the right tonsillar pillar is significantly enlarged and shifted to the midline.  Uvula is midline.  Stridor.  Normal phonation.  No trismus. Neck: No stridor. Painless ROM.  Cardiovascular: Normal rate, regular rhythm. Grossly normal heart sounds.  Good peripheral circulation. Respiratory: Normal respiratory effort.  No retractions. Lungs CTAB. Gastrointestinal: Soft and nontender. No distention. No abdominal bruits. No CVA tenderness. Genitourinary:  Musculoskeletal: No lower extremity tenderness nor edema.  No joint effusions. Neurologic:  Normal speech and language. No gross focal neurologic deficits are appreciated. No facial droop Skin:  Skin is warm, dry and intact. No rash noted. Psychiatric: Mood and affect are normal. Speech and behavior are normal.  ____________________________________________   LABS (all labs ordered are listed, but only abnormal results are displayed)  Results for orders placed or performed during the hospital encounter of 09/27/17 (from the past 24 hour(s))  Mononucleosis screen     Status: None   Collection Time: 09/27/17  4:25 PM  Result Value Ref Range   Mono  Screen NEGATIVE NEGATIVE  Basic metabolic panel     Status: Abnormal   Collection Time: 09/27/17  5:23 PM  Result Value Ref Range   Sodium 138 135 - 145 mmol/L   Potassium 4.1 3.5 - 5.1 mmol/L   Chloride 106 101 - 111 mmol/L   CO2 23 22 - 32 mmol/L   Glucose, Bld 97 65 - 99 mg/dL   BUN 12 6 - 20 mg/dL   Creatinine, Ser 1.61 0.61 - 1.24 mg/dL   Calcium 8.4 (L) 8.9 - 10.3 mg/dL   GFR calc non Af Amer >60 >60 mL/min   GFR calc Af Amer >60 >60 mL/min   Anion gap 9 5 - 15   ____________________________________________ __________________________________  RADIOLOGY  I personally reviewed all radiographic images ordered to evaluate for the above acute complaints and reviewed radiology reports and findings.  These findings were personally discussed with the patient.  Please see medical record for radiology report.  ____________________________________________   PROCEDURES  Procedure(s) performed:  Marland KitchenMarland KitchenIncision and Drainage Date/Time: 09/27/2017  10:01 PM Performed by: Willy Eddy, MD Authorized by: Willy Eddy, MD   Consent:    Consent obtained:  Verbal   Consent given by:  Patient   Risks discussed:  Bleeding, infection, incomplete drainage, pain and damage to other organs   Alternatives discussed:  Alternative treatment, delayed treatment and observation Location:    Type:  Abscess   Location:  Mouth   Mouth location:  Peritonsillar (right) Anesthesia (see MAR for exact dosages):    Anesthesia method:  Local infiltration and topical application   Topical anesthesia: aerosolized lidocaine.   Local anesthetic:  Lidocaine 1% WITH epi Procedure type:    Complexity:  Complex Procedure details:    Needle aspiration: yes     Needle size:  18 G   Drainage:  Purulent   Drainage amount:  Scant   Wound treatment:  Wound left open   Packing materials:  None Post-procedure details:    Patient tolerance of procedure:  Tolerated well, no immediate  complications      Critical Care performed: no ____________________________________________   INITIAL IMPRESSION / ASSESSMENT AND PLAN / ED COURSE  Pertinent labs & imaging results that were available during my care of the patient were reviewed by me and considered in my medical decision making (see chart for details).  DDX: PTA, RPA, strep, mono, epiglottitis, aneurysm  RIFAT FINK is a 31 y.o. who presents to the ED with sore throat presentation concerning for the above differential.  He is currently protecting his airway.  Exam is limited due to redundant oropharyngeal tissue.  CT imaging ordered to evaluate for abscess versus significant lymphadenopathy.  CT imaging does show evidence of 1 cm abscess and no evidence of aneurysm or phlegmon.  Will perform aspiration.  Will give patient IV clindamycin and IV fluids.  Clinical Course as of Sep 28 2203  Tue Sep 27, 2017  2019 Was able to drain roughly half a cc to 1 cc of fluid from the abscess.  I spoke with Erline Hau of ENT who is recommended a dose of IV Rocephin as well as IV clindamycin, IM Depo-Medrol and start the patient on clindamycin with a steroid taper.  Patient still protecting his airway.  He is able to swallow.  We will continue to observe.  [PR]  2104 Patient reassessed.  Does still have some small amount of purulent drainage from needle insertion site.  Is still having some discomfort but overall patient feels better and is requesting something to eat.  [PR]  2203 Patient reassessed.  Is having persistent purulent drainage from the needle insertion site consistent with a draining abscess.  Patient feels improved.  He is tolerating oral hydration and ice cream.  Patient will be discharged home with clindamycin steroids and pain medications.  Have discussed with the patient and available family all diagnostics and treatments performed thus far and all questions were answered to the best of my ability. The patient  demonstrates understanding and agreement with plan.   [PR]    Clinical Course User Index [PR] Willy Eddy, MD      ____________________________________________   FINAL CLINICAL IMPRESSION(S) / ED DIAGNOSES  Final diagnoses:  Peritonsillar abscess      NEW MEDICATIONS STARTED DURING THIS VISIT:  This SmartLink is deprecated. Use AVSMEDLIST instead to display the medication list for a patient.   Note:  This document was prepared using Dragon voice recognition software and may include unintentional dictation errors.    Willy Eddy, MD 09/27/17 2204

## 2017-09-27 NOTE — ED Triage Notes (Signed)
Pt to ED from [facility] reporting 1.5 weeks of throat pains and swelling. Pt was dx with strep over the weekend but reports worsening of pain and "burning" sensation in neck. PT reports feeling as though his throat is swelling and reports a small increased in difficulty breathing but does not feel as though throat is closing. No fevers reported and no reports of drainage in throat.

## 2017-09-27 NOTE — ED Notes (Signed)
Patient transported to X-ray 

## 2017-09-27 NOTE — Discharge Instructions (Signed)
Return to the ER if symptoms worsen at any point particular diffuse feel like you are having trouble swallowing or the arthritis significantly swelling.  If symptoms not improved in 2-3 days.  Follow-up with Ear nose and throat.

## 2017-09-27 NOTE — ED Notes (Signed)
Called pharmacy to get rocephin and depo-medrol sent to ED

## 2017-11-04 ENCOUNTER — Other Ambulatory Visit: Payer: Self-pay | Admitting: Specialist

## 2017-11-04 DIAGNOSIS — R590 Localized enlarged lymph nodes: Secondary | ICD-10-CM

## 2017-11-10 ENCOUNTER — Telehealth: Payer: Self-pay | Admitting: *Deleted

## 2017-11-10 NOTE — Telephone Encounter (Signed)
   [facility] Pre-operative Risk Assessment    Request for surgical clearance:  1. What type of surgery is being performed? TONSILLECTOMY    2. When is this surgery scheduled? 11/29/17   3. Are there any medications that need to be held prior to surgery and how long?  4. Practice name and name of physician performing surgery? DR C MCQUEEN    5. What is your office phone and fax number? PHONE [phone] FAX [phone]   6. Anesthesia type (None, local, MAC, general) ? UNKNOWN

## 2017-11-11 NOTE — Telephone Encounter (Signed)
   Primary Cardiologist:No primary care provider on file.  Chart reviewed as part of pre-operative protocol coverage. Because of Shamon Ogan Garguilo's past medical history and time since last visit, he/she will require a follow-up visit in order to better assess preoperative cardiovascular risk.  Pre-op covering staff: - Please schedule appointment and call patient to inform them. - Please contact requesting surgeon's office via preferred method (i.e, phone, fax) to inform them of need for appointment prior to surgery.  Robbie Lis, PA-C  11/11/2017, 3:06 PM

## 2017-11-11 NOTE — Telephone Encounter (Signed)
Per Boyce Medici, PA pt will need appt before being cleared for surgery. Pt will be out of town next week and asked for the week following. Pt scheduled 11/21/17 with Joni Reining, PA.

## 2017-11-15 ENCOUNTER — Encounter: Payer: Self-pay | Admitting: Adult Health

## 2017-11-21 ENCOUNTER — Encounter (INDEPENDENT_AMBULATORY_CARE_PROVIDER_SITE_OTHER): Payer: Self-pay

## 2017-11-21 ENCOUNTER — Ambulatory Visit (INDEPENDENT_AMBULATORY_CARE_PROVIDER_SITE_OTHER): Payer: BLUE CROSS/BLUE SHIELD | Admitting: Adult Health

## 2017-11-21 ENCOUNTER — Encounter: Payer: Self-pay | Admitting: Adult Health

## 2017-11-21 VITALS — BP 143/92 | HR 72 | Ht 70.0 in | Wt 306.6 lb

## 2017-11-21 DIAGNOSIS — Z01810 Encounter for preprocedural cardiovascular examination: Secondary | ICD-10-CM

## 2017-11-21 DIAGNOSIS — Z720 Tobacco use: Secondary | ICD-10-CM | POA: Diagnosis not present

## 2017-11-21 DIAGNOSIS — I420 Dilated cardiomyopathy: Secondary | ICD-10-CM | POA: Diagnosis not present

## 2017-11-21 DIAGNOSIS — I10 Essential (primary) hypertension: Secondary | ICD-10-CM | POA: Diagnosis not present

## 2017-11-21 MED ORDER — CARVEDILOL 12.5 MG PO TABS: 12.5000 mg | ORAL_TABLET | Freq: Two times a day (BID) | ORAL | 11 refills | Status: AC

## 2017-11-21 MED ORDER — LISINOPRIL 20 MG PO TABS: 20.0000 mg | ORAL_TABLET | Freq: Every day | ORAL | 11 refills | Status: AC

## 2017-11-21 NOTE — Patient Instructions (Signed)
Medication Instructions:  NO CHANGES-Your physician recommends that you continue on your current medications as directed. Please refer to the Current Medication list given to you today.  If you need a refill on your cardiac medications before your next appointment, please call your pharmacy.  Special Instructions: CLEARED FOR TONSILLECTOMY WITH DR Linus Salmons ON 11-29-17  Follow-Up: Your physician wants you to follow-up in: 12 MONTHS WITH DR [id number]. You should receive a reminder letter in the mail two months in advance. If you do not receive a letter, please call our office November 2019 to schedule the January 2020 follow-up appointment.   Thank you for choosing [facility]!!

## 2017-11-21 NOTE — Progress Notes (Signed)
Agree. Thanks MCr 

## 2017-11-21 NOTE — Progress Notes (Addendum)
Cardiology Office Note   Date:  11/21/2017   ID:  Andrew Harmon, DOB 1986-06-24, MRN [id number]  PCP:  Dione Housekeeper, MD  Cardiologist:  Dr. Royann Shivers  Chief Complaint  Patient presents with  . Pre-op Exam  . Hypertension     History of Present Illness: Andrew Harmon is a 32 y.o. male who presents for ongoing assessment and management of pericarditis, myocarditis, status post pericardial window in 2015, OSA, on CPAP, hypertension.  He comes today for preoperative evaluation.  He is planned for tonsillectomy on 11/29/2017 with Dr. Linus Salmons.  The patient denies any new diagnoses, has been medically compliant, has not had any ER visits or surgeries since being seen last.  He unfortunately continues to smoke a half a pack a day.  Past Medical History:  Diagnosis Date  . Asthma   . 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 OSA on CPAP   . Pericarditis 11/2013    Past Surgical History:  Procedure Laterality Date  . INTRAOPERATIVE TRANSESOPHAGEAL ECHOCARDIOGRAM N/A 12/11/2013   Procedure: INTRAOPERATIVE TRANSESOPHAGEAL ECHOCARDIOGRAM;  Surgeon: Delight Ovens, MD;  Location: [facility] OR;  Service: Open Heart Surgery;  Laterality: N/A;  . PERICARDIAL WINDOW N/A 12/11/2013   Procedure: PERICARDIAL WINDOW;  Surgeon: Delight Ovens, MD;  Location: [facility] OR;  Service: Thoracic;  Laterality: N/A;  W/ TEE     Current Outpatient Medications  Medication Sig Dispense Refill  . acetaminophen (TYLENOL) 500 MG tablet Take 1,000 mg by mouth every 6 (six) hours as needed (for pain/headaches.).    Marland Kitchen carvedilol (COREG) 12.5 MG tablet Take 1 tablet (12.5 mg total) by mouth 2 (two) times daily with a meal. 60 tablet 11  . fluticasone (FLONASE) 50 MCG/ACT nasal spray Place 2 sprays into both nostrils daily. (Patient taking differently: Place 2 sprays into both nostrils daily as needed (for allergies. (Alternates between flonase & atrovent)). ) 16 g 0  . ibuprofen (ADVIL,MOTRIN) 600 MG tablet Take 1  tablet (600 mg total) by mouth every 6 (six) hours as needed. (Patient taking differently: Take 600 mg by mouth every 6 (six) hours as needed (for pain.). ) 30 tablet 0  . lisinopril (PRINIVIL,ZESTRIL) 20 MG tablet Take 1 tablet (20 mg total) by mouth daily. 30 tablet 11   No current facility-administered medications for this visit.     Allergies:   Amoxicillin; Penicillins; and Colchicine    Social History:  The patient  reports that he has been smoking cigarettes.  He has been smoking about 1.00 pack per day. he has never used smokeless tobacco. He reports that he drinks alcohol. He reports that he does not use drugs.   Family History:  The patient's family history includes Diabetes in his maternal grandmother and mother; Healthy in his sister; Heart disease in his maternal grandmother.    ROS: All other systems are reviewed and negative. Unless otherwise mentioned in H&P    PHYSICAL EXAM: VS:  BP (!) 143/92 (BP Location: Left Arm)   Pulse 72   Ht 5\' 10"  (1.778 m)   Wt (!) 306 lb 9.6 oz (139.1 kg)   BMI 43.99 kg/m  , BMI Body mass index is 43.99 kg/m.  Morbidly obese GEN: Well nourished, well developed, in no acute distress  HEENT: normal  Neck: no JVD, carotid bruits, or masses Cardiac RRR; no murmurs, rubs, or gallops,no edema  Respiratory:  clear to auscultation bilaterally, normal work of breathing GI: soft, nontender, nondistended, + BS MS: no deformity or  atrophy  Skin: warm and dry, no rash Neuro:  Strength and sensation are intact Psych: euthymic mood, full affect   EKG: Normal sinus rhythm, heart rate of 72 bpm.   Recent Labs: 09/27/2017: BUN 12; Creatinine, Ser 0.82; Potassium 4.1; Sodium 138    Lipid Panel No results found for: CHOL, TRIG, HDL, CHOLHDL, VLDL, LDLCALC, LDLDIRECT    Wt Readings from Last 3 Encounters:  11/21/17 (!) 306 lb 9.6 oz (139.1 kg)  09/27/17 (!) 309 lb (140.2 kg)  09/25/17 (!) 309 lb (140.2 kg)      Other studies Reviewed:   Echocardiogram 01/17/2017  - Left ventricle: The cavity size was normal. Wall thickness was   normal. Systolic function was normal. The estimated ejection   fraction was in the range of 55% to 60%. Wall motion was normal;   there were no regional wall motion abnormalities. Left   ventricular diastolic function parameters were normal. - Aortic valve: There was no stenosis. - Mitral valve: There was trivial regurgitation. - Right ventricle: The cavity size was normal. Systolic function   was normal. - Pulmonary arteries: No complete TR doppler jet so unable to   estimate PA systolic pressure. - Inferior vena cava: The vessel was normal in size. The   respirophasic diameter changes were in the normal range (>= 50%),   consistent with normal central venous pressure.  Impressions:  - Normal study.  ASSESSMENT AND PLAN:  1.  Hypertension: Not optimal today.  He is on both carvedilol 12.5 mg twice daily and lisinopril 20 mg daily.  He states that his blood pressure is normally lower.  It is preferable for blood pressure to be between 120 and 130 systolic, per JNC guidelines.  This will need to be monitored closely.  May need to titrate up carvedilol, or add chlorthalidone  to current medication regimen if it remains elevated.  2.  Preoperative evaluation: The patient is cleared from a cardiac standpoint is a low risk to undergo tonsillectomy on 11/29/2017 with Dr. Jenne Campus .  I have advised him to take all of his medications the day of surgery to prevent catecholamine surge and hypertension perioperatively.  If blood pressure remains elevated would recommend increasing dose of carvedilol.  3.  History of pericarditis status post window: The patient is completely asymptomatic for recurrent chest pain or shortness of breath.  No planned cardiac testing unless he becomes symptomatic.  4.  Ongoing tobacco abuse: I have advised the patient on smoking cessation.  Since he is going to be having  tonsillectomy, I have told him that this is a good time to quit as he will not be able to smoke perioperatively.  He is encouraged in this endeavor to prevent future CAD or lung disease in this young man.  5.  Morbid obesity: He is advised on weight loss and increase exercise.  He states he knows he should be more active.  I have encouraged him to begin an exercise program which will assist with better blood pressure control, and overall health benefit.  He verbalizes understanding.   Current medicines are reviewed at length with the patient today.    Labs/ tests ordered today include: None  Bettey Mare. Liborio Nixon, ANP, AACC   11/21/2017 12:16 PM    [facility] [address] [place], [place] [address] Phone: [phone]; Fax: [phone]

## 2017-11-22 ENCOUNTER — Inpatient Hospital Stay: Admission: RE | Admit: 2017-11-22 | Payer: BLUE CROSS/BLUE SHIELD | Source: Ambulatory Visit

## 2017-11-23 ENCOUNTER — Other Ambulatory Visit: Payer: Self-pay

## 2017-11-23 ENCOUNTER — Encounter
Admission: RE | Admit: 2017-11-23 | Discharge: 2017-11-23 | Disposition: A | Discharge status: Home / Self Care | Payer: BLUE CROSS/BLUE SHIELD | Source: Ambulatory Visit | Attending: Unknown Physician Specialty | Admitting: Unknown Physician Specialty

## 2017-11-23 DIAGNOSIS — Z79899 Other long term (current) drug therapy: Secondary | ICD-10-CM | POA: Insufficient documentation

## 2017-11-23 DIAGNOSIS — J3501 Chronic tonsillitis: Secondary | ICD-10-CM | POA: Insufficient documentation

## 2017-11-23 DIAGNOSIS — Z791 Long term (current) use of non-steroidal anti-inflammatories (NSAID): Secondary | ICD-10-CM | POA: Diagnosis not present

## 2017-11-23 DIAGNOSIS — Z7952 Long term (current) use of systemic steroids: Secondary | ICD-10-CM | POA: Diagnosis not present

## 2017-11-23 DIAGNOSIS — Z888 Allergy status to other drugs, medicaments and biological substances status: Secondary | ICD-10-CM | POA: Insufficient documentation

## 2017-11-23 DIAGNOSIS — F17210 Nicotine dependence, cigarettes, uncomplicated: Secondary | ICD-10-CM | POA: Diagnosis not present

## 2017-11-23 DIAGNOSIS — Z880 Allergy status to penicillin: Secondary | ICD-10-CM | POA: Diagnosis not present

## 2017-11-23 DIAGNOSIS — Z833 Family history of diabetes mellitus: Secondary | ICD-10-CM | POA: Diagnosis not present

## 2017-11-23 DIAGNOSIS — Z01812 Encounter for preprocedural laboratory examination: Secondary | ICD-10-CM | POA: Insufficient documentation

## 2017-11-23 LAB — CBC
HCT: 42.3 % (ref 40.0–52.0)
Hemoglobin: 14.6 g/dL (ref 13.0–18.0)
MCH: 31.2 pg (ref 26.0–34.0)
MCHC: 34.5 g/dL (ref 32.0–36.0)
MCV: 90.4 fL (ref 80.0–100.0)
Platelets: 225 10*3/uL (ref 150–440)
RBC: 4.67 MIL/uL (ref 4.40–5.90)
RDW: 13.3 % (ref 11.5–14.5)
WBC: 10.4 10*3/uL (ref 3.8–10.6)

## 2017-11-23 NOTE — Patient Instructions (Addendum)
Your procedure is scheduled on: 11/29/17 Tues Report to Same Day Surgery 2nd floor medical mall [facility] Entrance-take elevator on left to 2nd floor.  Check in with surgery information desk.) To find out your arrival time please call [phone] between 1PM - 3PM on 11/28/17 Mon  Remember: Instructions that are not followed completely may result in serious medical risk, up to and including death, or upon the discretion of your surgeon and anesthesiologist your surgery may need to be rescheduled.    _x___ 1. Do not eat food after midnight the night before your procedure. You may drink clear liquids up to 2 hours before you are scheduled to arrive at the hospital for your procedure.  Do not drink clear liquids within 2 hours of your scheduled arrival to the hospital.  Clear liquids include  --Water or Apple juice without pulp  --Clear carbohydrate beverage such as ClearFast or Gatorade  --Black Coffee or Clear Tea (No milk, no creamers, do not add anything to                  the coffee or Tea Type 1 and type 2 diabetics should only drink water.  No gum chewing or hard candies.     __x__ 2. No Alcohol for 24 hours before or after surgery.   __x__3. No Smoking for 24 prior to surgery.   ____  4. Bring all medications with you on the day of surgery if instructed.    __x__ 5. Notify your doctor if there is any change in your medical condition     (cold, fever, infections).     Do not wear jewelry, make-up, hairpins, clips or nail polish.  Do not wear lotions, powders, or perfumes. You may wear deodorant.  Do not shave 48 hours prior to surgery. Men may shave face and neck.  Do not bring valuables to the hospital.    [facility] is not responsible for any belongings or valuables.               Contacts, dentures or bridgework may not be worn into surgery.  Leave your suitcase in the car. After surgery it may be brought to your room.  For patients admitted to the hospital, discharge  time is determined by your                       treatment team.   Patients discharged the day of surgery will not be allowed to drive home.  You will need someone to drive you home and stay with you the night of your procedure.    Please read over the following fact sheets that you were given:   [facility] Preparing for Surgery and or MRSA Information   _x___ Take anti-hypertensive listed below, cardiac, seizure, asthma,     anti-reflux and psychiatric medicines. These include:  1. carvedilol (COREG) 12.5 MG tablet  2.  3.  4.  5.  6.  ____Fleets enema or Magnesium Citrate as directed.   _x___ Use CHG Soap or sage wipes as directed on instruction sheet   ____ Use inhalers on the day of surgery and bring to hospital day of surgery  ____ Stop Metformin and Janumet 2 days prior to surgery.    ____ Take 1/2 of usual insulin dose the night before surgery and none on the morning     surgery.   _x___ Follow recommendations from Cardiologist, Pulmonologist or PCP regarding  stopping Aspirin, Coumadin, Plavix ,Eliquis, Effient, or Pradaxa, and Pletal.  X____Stop Anti-inflammatories such as Advil, Aleve, Ibuprofen, Motrin, Naproxen, Naprosyn, Goodies powders or aspirin products. OK to take Tylenol and                          Celebrex.   _x___ Stop supplements until after surgery.  But may continue Vitamin D, Vitamin B,       and multivitamin.   __x__ Bring C-Pap to the hospital.

## 2017-11-28 ENCOUNTER — Telehealth: Payer: Self-pay | Admitting: Cardiovascular Disease

## 2017-11-28 NOTE — Telephone Encounter (Signed)
Sent via Epic as requested. Did leave message if not received to call back

## 2017-11-28 NOTE — Telephone Encounter (Signed)
Becky calling to chec on the status of clearance.Pt is scheduled for procedure tomorrow.Please fax asap to [phone].

## 2017-11-29 ENCOUNTER — Ambulatory Visit: Payer: BLUE CROSS/BLUE SHIELD | Admitting: Anesthesiology

## 2017-11-29 ENCOUNTER — Encounter: Payer: Self-pay | Admitting: *Deleted

## 2017-11-29 ENCOUNTER — Other Ambulatory Visit: Payer: Self-pay

## 2017-11-29 ENCOUNTER — Encounter
Admission: RE | Disposition: A | Discharge status: Home / Self Care | Payer: Self-pay | Source: Ambulatory Visit | Attending: Unknown Physician Specialty

## 2017-11-29 ENCOUNTER — Ambulatory Visit
Admission: RE | Admit: 2017-11-29 | Discharge: 2017-11-29 | Disposition: A | Discharge status: Home / Self Care | Payer: BLUE CROSS/BLUE SHIELD | Source: Ambulatory Visit | Attending: Unknown Physician Specialty | Admitting: Unknown Physician Specialty

## 2017-11-29 DIAGNOSIS — Z880 Allergy status to penicillin: Secondary | ICD-10-CM | POA: Insufficient documentation

## 2017-11-29 DIAGNOSIS — Z79899 Other long term (current) drug therapy: Secondary | ICD-10-CM | POA: Diagnosis not present

## 2017-11-29 DIAGNOSIS — J45909 Unspecified asthma, uncomplicated: Secondary | ICD-10-CM | POA: Diagnosis not present

## 2017-11-29 DIAGNOSIS — I10 Essential (primary) hypertension: Secondary | ICD-10-CM | POA: Insufficient documentation

## 2017-11-29 DIAGNOSIS — Z6841 Body Mass Index (BMI) 40.0 and over, adult: Secondary | ICD-10-CM | POA: Diagnosis not present

## 2017-11-29 DIAGNOSIS — G4730 Sleep apnea, unspecified: Secondary | ICD-10-CM | POA: Diagnosis not present

## 2017-11-29 DIAGNOSIS — J3501 Chronic tonsillitis: Secondary | ICD-10-CM | POA: Insufficient documentation

## 2017-11-29 DIAGNOSIS — F17200 Nicotine dependence, unspecified, uncomplicated: Secondary | ICD-10-CM | POA: Diagnosis not present

## 2017-11-29 HISTORY — PX: TONSILLECTOMY: SHX5217

## 2017-11-29 SURGERY — TONSILLECTOMY
Anesthesia: General

## 2017-11-29 MED ORDER — BUPIVACAINE HCL (PF) 0.5 % IJ SOLN
INTRAMUSCULAR | Status: AC
  Filled 2017-11-29: qty 30

## 2017-11-29 MED ORDER — LABETALOL HCL 5 MG/ML IV SOLN
10.0000 mg | INTRAVENOUS | Status: AC | PRN
  Administered 2017-11-29 (×2): 10 mg via INTRAVENOUS

## 2017-11-29 MED ORDER — HYDROMORPHONE HCL 1 MG/ML IJ SOLN
0.5000 mg | INTRAMUSCULAR | Status: AC | PRN
  Administered 2017-11-29 (×4): 0.5 mg via INTRAVENOUS

## 2017-11-29 MED ORDER — LABETALOL HCL 5 MG/ML IV SOLN
INTRAVENOUS | Status: AC
  Administered 2017-11-29: 10 mg via INTRAVENOUS
  Filled 2017-11-29: qty 4

## 2017-11-29 MED ORDER — ONDANSETRON HCL 4 MG/2ML IJ SOLN
INTRAMUSCULAR | Status: AC
  Filled 2017-11-29: qty 2

## 2017-11-29 MED ORDER — MIDAZOLAM HCL 2 MG/2ML IJ SOLN
INTRAMUSCULAR | Status: DC | PRN
  Administered 2017-11-29: 2 mg via INTRAVENOUS

## 2017-11-29 MED ORDER — DEXAMETHASONE SODIUM PHOSPHATE 10 MG/ML IJ SOLN
INTRAMUSCULAR | Status: DC | PRN
  Administered 2017-11-29: 10 mg via INTRAVENOUS

## 2017-11-29 MED ORDER — ROCURONIUM BROMIDE 50 MG/5ML IV SOLN
INTRAVENOUS | Status: AC
  Filled 2017-11-29: qty 1

## 2017-11-29 MED ORDER — FAMOTIDINE 20 MG PO TABS
ORAL_TABLET | ORAL | Status: AC
  Administered 2017-11-29: 20 mg via ORAL
  Filled 2017-11-29: qty 1

## 2017-11-29 MED ORDER — FENTANYL CITRATE (PF) 100 MCG/2ML IJ SOLN
INTRAMUSCULAR | Status: AC
  Administered 2017-11-29: 25 ug via INTRAVENOUS
  Filled 2017-11-29: qty 2

## 2017-11-29 MED ORDER — CARVEDILOL 12.5 MG PO TABS
ORAL_TABLET | ORAL | Status: AC
  Filled 2017-11-29: qty 1

## 2017-11-29 MED ORDER — LIDOCAINE HCL (CARDIAC) 20 MG/ML IV SOLN
INTRAVENOUS | Status: DC | PRN
  Administered 2017-11-29: 100 mg via INTRAVENOUS

## 2017-11-29 MED ORDER — HYDROCODONE-ACETAMINOPHEN 7.5-325 MG/15ML PO SOLN
15.0000 mL | Freq: Four times a day (QID) | ORAL | Status: DC | PRN
  Administered 2017-11-29: 15 mL via ORAL

## 2017-11-29 MED ORDER — SUCCINYLCHOLINE CHLORIDE 20 MG/ML IJ SOLN
INTRAMUSCULAR | Status: DC | PRN
  Administered 2017-11-29: 140 mg via INTRAVENOUS

## 2017-11-29 MED ORDER — GLYCOPYRROLATE 0.2 MG/ML IJ SOLN
INTRAMUSCULAR | Status: DC | PRN
  Administered 2017-11-29: 0.2 mg via INTRAVENOUS

## 2017-11-29 MED ORDER — LACTATED RINGERS IV SOLN
INTRAVENOUS | Status: DC
  Administered 2017-11-29: 06:00:00 via INTRAVENOUS

## 2017-11-29 MED ORDER — BUPIVACAINE HCL (PF) 0.5 % IJ SOLN
INTRAMUSCULAR | Status: DC | PRN
  Administered 2017-11-29: 6 mL

## 2017-11-29 MED ORDER — LIDOCAINE HCL (PF) 2 % IJ SOLN
INTRAMUSCULAR | Status: AC
  Filled 2017-11-29: qty 10

## 2017-11-29 MED ORDER — MIDAZOLAM HCL 2 MG/2ML IJ SOLN
INTRAMUSCULAR | Status: AC
  Filled 2017-11-29: qty 2

## 2017-11-29 MED ORDER — PROPOFOL 10 MG/ML IV BOLUS
INTRAVENOUS | Status: AC
  Filled 2017-11-29: qty 40

## 2017-11-29 MED ORDER — CARVEDILOL 12.5 MG PO TABS
12.5000 mg | ORAL_TABLET | Freq: Once | ORAL | Status: AC
  Administered 2017-11-29: 12.5 mg via ORAL

## 2017-11-29 MED ORDER — HYDROCODONE-ACETAMINOPHEN 7.5-325 MG/15ML PO SOLN
ORAL | Status: AC
  Filled 2017-11-29: qty 15

## 2017-11-29 MED ORDER — FENTANYL CITRATE (PF) 100 MCG/2ML IJ SOLN
INTRAMUSCULAR | Status: AC
  Filled 2017-11-29: qty 2

## 2017-11-29 MED ORDER — HYDROCODONE-ACETAMINOPHEN 7.5-325 MG/15ML PO SOLN: 15.0000 mL | Freq: Four times a day (QID) | ORAL | 0 refills | Status: AC | PRN

## 2017-11-29 MED ORDER — FAMOTIDINE 20 MG PO TABS
20.0000 mg | ORAL_TABLET | Freq: Once | ORAL | Status: AC
  Administered 2017-11-29: 20 mg via ORAL

## 2017-11-29 MED ORDER — ONDANSETRON HCL 4 MG/2ML IJ SOLN
INTRAMUSCULAR | Status: DC | PRN
  Administered 2017-11-29: 4 mg via INTRAVENOUS

## 2017-11-29 MED ORDER — GLYCOPYRROLATE 0.2 MG/ML IJ SOLN
INTRAMUSCULAR | Status: AC
  Filled 2017-11-29: qty 1

## 2017-11-29 MED ORDER — FENTANYL CITRATE (PF) 100 MCG/2ML IJ SOLN
25.0000 ug | INTRAMUSCULAR | Status: DC | PRN
  Administered 2017-11-29 (×2): 25 ug via INTRAVENOUS

## 2017-11-29 MED ORDER — HYDROMORPHONE HCL 1 MG/ML IJ SOLN
INTRAMUSCULAR | Status: AC
  Administered 2017-11-29: 0.5 mg via INTRAVENOUS
  Filled 2017-11-29: qty 1

## 2017-11-29 MED ORDER — FENTANYL CITRATE (PF) 100 MCG/2ML IJ SOLN
INTRAMUSCULAR | Status: DC | PRN
  Administered 2017-11-29: 50 ug via INTRAVENOUS
  Administered 2017-11-29: 25 ug via INTRAVENOUS
  Administered 2017-11-29: 50 ug via INTRAVENOUS
  Administered 2017-11-29: 75 ug via INTRAVENOUS

## 2017-11-29 MED ORDER — DEXAMETHASONE SODIUM PHOSPHATE 10 MG/ML IJ SOLN
INTRAMUSCULAR | Status: AC
  Filled 2017-11-29: qty 1

## 2017-11-29 MED ORDER — PROPOFOL 10 MG/ML IV BOLUS
INTRAVENOUS | Status: DC | PRN
  Administered 2017-11-29: 100 mg via INTRAVENOUS
  Administered 2017-11-29: 250 mg via INTRAVENOUS

## 2017-11-29 MED ORDER — SUCCINYLCHOLINE CHLORIDE 20 MG/ML IJ SOLN
INTRAMUSCULAR | Status: AC
  Filled 2017-11-29: qty 1

## 2017-11-29 MED ORDER — ONDANSETRON HCL 4 MG/2ML IJ SOLN: 4.0000 mg | Freq: Once | INTRAMUSCULAR | Status: DC | PRN

## 2017-11-29 SURGICAL SUPPLY — 13 items
CANISTER SUCT 1200ML W/VALVE (MISCELLANEOUS) ×3 IMPLANT
CATH ROBINSON RED A/P 10FR (CATHETERS) ×3 IMPLANT
COAG SUCT 10F 3.5MM HAND CTRL (MISCELLANEOUS) ×3 IMPLANT
ELECT CAUTERY BLADE TIP 2.5 (TIP) ×3
ELECT REM PT RETURN 9FT ADLT (ELECTROSURGICAL) ×3
ELECTRODE CAUTERY BLDE TIP 2.5 (TIP) ×1 IMPLANT
ELECTRODE REM PT RTRN 9FT ADLT (ELECTROSURGICAL) ×1 IMPLANT
GLOVE BIO SURGEON STRL SZ7.5 (GLOVE) ×3 IMPLANT
HANDLE SUCTION POOLE (INSTRUMENTS) ×1 IMPLANT
LABEL OR SOLS (LABEL) IMPLANT
NS IRRIG 500ML POUR BTL (IV SOLUTION) ×3 IMPLANT
PACK HEAD/NECK (MISCELLANEOUS) ×3 IMPLANT
SUCTION POOLE HANDLE (INSTRUMENTS) ×3

## 2017-11-29 NOTE — Discharge Instructions (Signed)

## 2017-11-29 NOTE — Anesthesia Post-op Follow-up Note (Signed)
Anesthesia QCDR form completed.        

## 2017-11-29 NOTE — Op Note (Signed)
PREOPERATIVE DIAGNOSIS:  CHRONIC TONSILLITIS  POSTOPERATIVE DIAGNOSIS:  Chronic Tonsillitis  OPERATION:  Tonsillectomy.  SURGEON:  Davina Poke, MD  ANESTHESIA:  General endotracheal.  OPERATIVE FINDINGS:  Large tonsils.  DESCRIPTION OF THE PROCEDURE: Andrew Harmon was identified in the holding area and taken to the operating room and placed in the supine position.  After general endotracheal anesthesia, the table was turned 45 degrees and the patient was draped in the usual fashion for a tonsillectomy.  A mouth gag was inserted into the oral cavity.  There were large tonsils.  Beginning on the left-hand side a tenaculum was used to grasp the tonsil and the Bovie cautery was used to dissect it free from the fossa.  In a similar fashion, the right tonsil was removed.  Meticulous hemostasis was achieved using the Bovie cautery.  With both tonsils removed and no active bleeding, 0.5% plain Marcaine was used to inject the anterior and posterior tonsillar pillars bilaterally.  A total of 17ml was used.  The patient tolerated the procedure well and was awakened in the operating room and taken to the recovery room in stable condition.   CULTURES:  None.  SPECIMENS:  Tonsils.  ESTIMATED BLOOD LOSS:  Less than 10 ml.  Davina Poke  11/29/2017  8:11 AM

## 2017-11-29 NOTE — Anesthesia Preprocedure Evaluation (Signed)
Anesthesia Evaluation  Patient identified by MRN, date of birth, ID band Patient awake    Reviewed: Allergy & Precautions, H&P , NPO status , Patient's Chart, lab work & pertinent test results, reviewed documented beta blocker date and time   History of Anesthesia Complications Negative for: history of anesthetic complications  Airway Mallampati: II  TM Distance: >3 FB Neck ROM: Full    Dental  (+) Teeth Intact, Dental Advisory Given   Pulmonary asthma , sleep apnea , pneumonia, resolved, Current Smoker,  Childhood asthma; resolved    (-) decreased breath sounds      Cardiovascular hypertension, Pt. on medications and Pt. on home beta blockers  Rhythm:Regular Rate:Tachycardia  Pericardial effusion   Neuro/Psych negative neurological ROS  negative psych ROS   GI/Hepatic negative GI ROS, Neg liver ROS,   Endo/Other  Morbid obesityObesity  Renal/GU negative Renal ROS  negative genitourinary   Musculoskeletal negative musculoskeletal ROS (+)   Abdominal   Peds negative pediatric ROS (+)  Hematology negative hematology ROS (+)   Anesthesia Other Findings   Reproductive/Obstetrics negative OB ROS                             Anesthesia Physical  Anesthesia Plan  ASA: III  Anesthesia Plan: General   Post-op Pain Management:    Induction: Intravenous, Rapid sequence and Cricoid pressure planned  PONV Risk Score and Plan:   Airway Management Planned: Oral ETT  Additional Equipment:   Intra-op Plan:   Post-operative Plan:   Informed Consent: I have reviewed the patients History and Physical, chart, labs and discussed the procedure including the risks, benefits and alternatives for the proposed anesthesia with the patient or authorized representative who has indicated his/her understanding and acceptance.   Dental advisory given  Plan Discussed with: CRNA and  Anesthesiologist  Anesthesia Plan Comments:         Anesthesia Quick Evaluation

## 2017-11-29 NOTE — OR Nursing (Signed)
Discussed discharge instructions with pt and mom. Both voice understanding

## 2017-11-29 NOTE — H&P (Signed)
The patient's history has been reviewed, patient examined, no change in status, stable for surgery.  Questions were answered to the patients satisfaction.  

## 2017-11-29 NOTE — Anesthesia Procedure Notes (Signed)
Procedure Name: Intubation Date/Time: 11/29/2017 7:24 AM Performed by: Doreen Salvage, CRNA Pre-anesthesia Checklist: Patient identified, Patient being monitored, Timeout performed, Emergency Drugs available and Suction available Patient Re-evaluated:Patient Re-evaluated prior to induction Oxygen Delivery Method: Circle system utilized Preoxygenation: Pre-oxygenation with 100% oxygen Induction Type: IV induction Ventilation: Mask ventilation without difficulty Laryngoscope Size: Mac, 3 and 4 Grade View: Grade II Tube type: Oral Rae Tube size: 7.5 mm Number of attempts: 1 Airway Equipment and Method: Stylet Placement Confirmation: ETT inserted through vocal cords under direct vision,  positive ETCO2 and breath sounds checked- equal and bilateral Secured at: 23 cm Tube secured with: Tape Dental Injury: Teeth and Oropharynx as per pre-operative assessment  Difficulty Due To: Difficult Airway- due to anterior larynx and Difficult Airway- due to limited oral opening

## 2017-11-29 NOTE — Anesthesia Postprocedure Evaluation (Signed)
Anesthesia Post Note  Patient: Andrew Harmon  Procedure(s) Performed: TONSILLECTOMY (N/A )  Patient location during evaluation: PACU Anesthesia Type: General Level of consciousness: awake and alert and oriented Pain management: pain level controlled Vital Signs Assessment: post-procedure vital signs reviewed and stable Respiratory status: spontaneous breathing Cardiovascular status: blood pressure returned to baseline Anesthetic complications: no     Last Vitals:  Vitals:   11/29/17 0953 11/29/17 1001  BP: 130/75 124/62  Pulse: 76 77  Resp: 20 20  Temp: 36.9 C   SpO2: 94% 96%    Last Pain:  Vitals:   11/29/17 1025  TempSrc:   PainSc: 5                  Carlis Blanchard

## 2017-11-29 NOTE — Transfer of Care (Signed)
Immediate Anesthesia Transfer of Care Note  Patient: Andrew Harmon  Procedure(s) Performed: Procedure(s): TONSILLECTOMY (N/A)  Patient Location: PACU  Anesthesia Type:General  Level of Consciousness: sedated  Airway & Oxygen Therapy: Patient Spontanous Breathing and Patient connected to face mask oxygen  Post-op Assessment: Report given to RN and Post -op Vital signs reviewed and stable  Post vital signs: Reviewed and stable  Last Vitals:  Vitals:   11/29/17 0602 11/29/17 0820  BP: 135/76 (!) 163/117  Pulse: 71 95  Resp: 18 18  Temp: 36.5 C (!) 36.3 C  SpO2: 99% 99%    Complications: No apparent anesthesia complications

## 2017-12-01 LAB — SURGICAL PATHOLOGY

## 2019-02-12 ENCOUNTER — Telehealth: Payer: Self-pay | Admitting: *Deleted

## 2019-02-12 NOTE — Telephone Encounter (Signed)
Unable to leave a message,mailbox is full. 

## 2019-03-21 ENCOUNTER — Telehealth: Payer: Self-pay | Admitting: *Deleted

## 2019-03-21 NOTE — Telephone Encounter (Signed)
A message was left, re: follow up visit. 

## 2020-02-18 NOTE — Progress Notes (Deleted)
Cardiology Office Note   Date:  02/18/2020   ID:  Andrew Harmon, DOB 1986-05-26, MRN [id number]  PCP:  Dione Housekeeper, MD  Cardiologist:  No primary care provider on file. EP: None  No chief complaint on file.     History of Present Illness: Andrew Harmon is a 34 y.o. male with a PMH of pericarditis/myocarditis s/p pericardial window in 2015, HTN, asthma, OSA on CPAP, morbid obesity, and tobacco abuse, who presents for routine follow-up.  He was last evaluated by cardiology at an outpatient visit with Joni Reining, NP 10/2017, at which time he was doing fine from a cardiac standpoint and was anticipating a tonsillectomy in the coming weeks. He was cleared for surgery and recommended to monitor BP at home to determine if carvedilol needed to be uptitrated. He has not been seen since that time. Echo in 2018 showed EF 55-60%, normal LV diastolic function, no RWMA, and trivial MR.  He presents today for routine follow-up.  1. HTN: BP *** today - Continue carvedilol and lisinopril  2. Hx of pericarditis/myocarditis: s/p pericardial window.  - Continue to monitor for recurrent symptoms  3. Tobacco abuse:  - Continue to encourage cessation  4. OSA on CPAP:  - Continue CPAP  5. Morbid obesity: BMI ***. Likely the crux of many of his issues - Continue to encourage healthy dietary/lifestyle modifications to promote weight loss    Past Medical History:  Diagnosis Date  . Asthma   . 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 OSA on CPAP   . Pericarditis 11/2013    Past Surgical History:  Procedure Laterality Date  . INTRAOPERATIVE TRANSESOPHAGEAL ECHOCARDIOGRAM N/A 12/11/2013   Procedure: INTRAOPERATIVE TRANSESOPHAGEAL ECHOCARDIOGRAM;  Surgeon: Delight Ovens, MD;  Location: [facility] OR;  Service: Open Heart Surgery;  Laterality: N/A;  . PERICARDIAL WINDOW N/A 12/11/2013   Procedure: PERICARDIAL WINDOW;  Surgeon: Delight Ovens, MD;  Location: [facility] OR;  Service: Thoracic;   Laterality: N/A;  W/ TEE  . TONSILLECTOMY N/A 11/29/2017   Procedure: TONSILLECTOMY;  Surgeon: Linus Salmons, MD;  Location: [facility] ORS;  Service: ENT;  Laterality: N/A;     Current Outpatient Medications  Medication Sig Dispense Refill  . acetaminophen (TYLENOL) 500 MG tablet Take 1,000 mg by mouth every 6 (six) hours as needed (for pain/headaches.).    Marland Kitchen carvedilol (COREG) 12.5 MG tablet Take 1 tablet (12.5 mg total) by mouth 2 (two) times daily with a meal. 60 tablet 11  . ibuprofen (ADVIL,MOTRIN) 600 MG tablet Take 1 tablet (600 mg total) by mouth every 6 (six) hours as needed. (Patient taking differently: Take 600 mg by mouth every 6 (six) hours as needed (for pain.). ) 30 tablet 0  . lisinopril (PRINIVIL,ZESTRIL) 20 MG tablet Take 1 tablet (20 mg total) by mouth daily. 30 tablet 11   No current facility-administered medications for this visit.    Allergies:   Amoxicillin, Penicillins, and Colchicine    Social History:  The patient  reports that he has been smoking cigarettes. He has been smoking about 0.50 packs per day. He has never used smokeless tobacco. He reports current alcohol use of about 1.0 standard drinks of alcohol per week. He reports that he does not use drugs.   Family History:  The patient's ***family history includes Diabetes in his maternal grandmother and mother; Healthy in his sister; Heart disease in his maternal grandmother.    ROS:  Please see the history of present illness.   Otherwise,  review of systems are positive for {NONE DEFAULTED:18576::"none"}.   All other systems are reviewed and negative.    PHYSICAL EXAM: VS:  There were no vitals taken for this visit. , BMI There is no height or weight on file to calculate BMI. GEN: Well nourished, well developed, in no acute distress HEENT: normal Neck: no JVD, carotid bruits, or masses Cardiac: ***RRR; no murmurs, rubs, or gallops,no edema  Respiratory:  clear to auscultation bilaterally, normal work of  breathing GI: soft, nontender, nondistended, + BS MS: no deformity or atrophy Skin: warm and dry, no rash Neuro:  Strength and sensation are intact Psych: euthymic mood, full affect   EKG:  EKG {ACTION; IS/IS OVF:[id number]} ordered today. The ekg ordered today demonstrates ***   Recent Labs: No results found for requested labs within last 8760 hours.    Lipid Panel No results found for: CHOL, TRIG, HDL, CHOLHDL, VLDL, LDLCALC, LDLDIRECT    Wt Readings from Last 3 Encounters:  11/23/17 (!) 309 lb (140.2 kg)  11/21/17 (!) 306 lb 9.6 oz (139.1 kg)  09/27/17 (!) 309 lb (140.2 kg)      Other studies Reviewed: Additional studies/ records that were reviewed today include:   Echocardiogram 2018: - Left ventricle: The cavity size was normal. Wall thickness was  normal. Systolic function was normal. The estimated ejection  fraction was in the range of 55% to 60%. Wall motion was normal;  there were no regional wall motion abnormalities. Left  ventricular diastolic function parameters were normal.  - Aortic valve: There was no stenosis.  - Mitral valve: There was trivial regurgitation.  - Right ventricle: The cavity size was normal. Systolic function  was normal.  - Pulmonary arteries: No complete TR doppler jet so unable to  estimate PA systolic pressure.  - Inferior vena cava: The vessel was normal in size. The  respirophasic diameter changes were in the normal range (>= 50%),  consistent with normal central venous pressure.   Impressions:   - Normal study.     ASSESSMENT AND PLAN:  1.  ***   Current medicines are reviewed at length with the patient today.  The patient {ACTIONS; HAS/DOES NOT HAVE:19233} concerns regarding medicines.  The following changes have been made:  {PLAN; NO CHANGE:13088:s}  Labs/ tests ordered today include: *** No orders of the defined types were placed in this encounter.    Disposition:   FU with *** in {gen number  [id number]:[id number]} {Days to years:10300}  Signed, Abigail Butts, PA-C  02/18/2020 8:07 AM

## 2020-02-19 ENCOUNTER — Ambulatory Visit: Payer: BLUE CROSS/BLUE SHIELD | Admitting: Medical
